# Patient Record
Sex: Female | Born: 1945 | Race: White | Hispanic: No | Smoking: Never smoker
Health system: Southern US, Community
[De-identification: ages and names within clinical notes are randomized; demographics above are authoritative.]

## PROBLEM LIST (undated history)

## (undated) DIAGNOSIS — I10 Essential (primary) hypertension: Secondary | ICD-10-CM

## (undated) DIAGNOSIS — F329 Major depressive disorder, single episode, unspecified: Secondary | ICD-10-CM

## (undated) DIAGNOSIS — M549 Dorsalgia, unspecified: Secondary | ICD-10-CM

## (undated) DIAGNOSIS — C569 Malignant neoplasm of unspecified ovary: Secondary | ICD-10-CM

## (undated) DIAGNOSIS — Z720 Tobacco use: Secondary | ICD-10-CM

## (undated) DIAGNOSIS — G4730 Sleep apnea, unspecified: Secondary | ICD-10-CM

## (undated) DIAGNOSIS — J449 Chronic obstructive pulmonary disease, unspecified: Secondary | ICD-10-CM

## (undated) DIAGNOSIS — F32A Depression, unspecified: Secondary | ICD-10-CM

## (undated) DIAGNOSIS — F419 Anxiety disorder, unspecified: Secondary | ICD-10-CM

## (undated) HISTORY — DX: Major depressive disorder, single episode, unspecified: F32.9

## (undated) HISTORY — DX: Tobacco use: Z72.0

## (undated) HISTORY — DX: Malignant neoplasm of unspecified ovary: C56.9

## (undated) HISTORY — DX: Anxiety disorder, unspecified: F41.9

## (undated) HISTORY — PX: ABDOMINAL HYSTERECTOMY: SHX81

## (undated) HISTORY — PX: JOINT REPLACEMENT: SHX530

## (undated) HISTORY — PX: OOPHORECTOMY: SHX86

## (undated) HISTORY — DX: Depression, unspecified: F32.A

## (undated) HISTORY — DX: Sleep apnea, unspecified: G47.30

## (undated) HISTORY — PX: HYSTERECTOMY ABDOMINAL WITH SALPINGECTOMY: SHX6725

## (undated) HISTORY — PX: KNEE SURGERY: SHX244

---

## 2014-06-22 ENCOUNTER — Emergency Department (HOSPITAL_COMMUNITY)
Admission: EM | Admit: 2014-06-22 | Discharge: 2014-06-22 | Disposition: A | Discharge status: Home / Self Care | Payer: Medicare Other | Attending: Emergency Medicine | Admitting: Emergency Medicine

## 2014-06-22 ENCOUNTER — Encounter (HOSPITAL_COMMUNITY): Payer: Self-pay | Admitting: Emergency Medicine

## 2014-06-22 DIAGNOSIS — M545 Low back pain, unspecified: Secondary | ICD-10-CM | POA: Diagnosis not present

## 2014-06-22 DIAGNOSIS — G8929 Other chronic pain: Secondary | ICD-10-CM | POA: Diagnosis not present

## 2014-06-22 DIAGNOSIS — M549 Dorsalgia, unspecified: Secondary | ICD-10-CM | POA: Insufficient documentation

## 2014-06-22 DIAGNOSIS — Z9889 Other specified postprocedural states: Secondary | ICD-10-CM | POA: Insufficient documentation

## 2014-06-22 HISTORY — DX: Dorsalgia, unspecified: M54.9

## 2014-06-22 MED ORDER — HYDROCODONE-ACETAMINOPHEN 5-325 MG PO TABS: 1.0000 | ORAL_TABLET | ORAL | Status: DC | PRN

## 2014-06-22 MED ORDER — HYDROCODONE-ACETAMINOPHEN 5-325 MG PO TABS
1.0000 | ORAL_TABLET | Freq: Once | ORAL | Status: AC
  Administered 2014-06-22: 1 via ORAL
  Filled 2014-06-22: qty 1

## 2014-06-22 MED ORDER — METHOCARBAMOL 500 MG PO TABS: 500.0000 mg | ORAL_TABLET | Freq: Two times a day (BID) | ORAL | Status: DC

## 2014-06-22 NOTE — Discharge Instructions (Signed)
Take the prescribed medication as directed for pain.  Use caution when driving. Follow-up with [facility]. Return to the ED for new or worsening symptoms.

## 2014-06-22 NOTE — ED Notes (Signed)
Pt has had 2 previous back surgeries since 2004.  Pt was taking pain meds, muscle relaxers and Xanax for depression in 2014. Pt takes Aleve and until 3 days ago Aleve was effective.

## 2014-06-22 NOTE — ED Notes (Signed)
Pt and daughter requesting muscle relaxant prescription.  Obtained from PA.

## 2014-06-22 NOTE — ED Notes (Signed)
Pt presents to department for evaluation of chronic back pain. Ongoing x3 years, pt states pain increased today. Denies recent injury. NAD.

## 2014-06-22 NOTE — ED Provider Notes (Signed)
CSN: [id number]     Arrival date & time 06/22/14  1753 History   First MD Initiated Contact with Patient 06/22/14 1814    This chart was scribed for non-physician practitioner, Quincy Carnes, working with Charlesetta Shanks, MD by Terressa Koyanagi, ED Scribe. This patient was seen in room TR08C/TR08C and the patient's care was started at 6:35 PM.  Chief Complaint  Patient presents with  . Back Pain   The history is provided by the patient. No language interpreter was used.   HPI Comments: PCP: No primary provider on file. Alexis Daniels is a 68 y.o. female, with Hx of chronic back pain and two back surgeries (2009 and 2011), who presents to the Emergency Department complaining of chronic back pain with the pain intensifying today. Pt reports that she is generally able to control her pain with Aleve, however, has been unable to do so today. Pt denies any recent injuries to said area or recent falls. Pt notes that she was on narcotic pain killers in the past for her back pain, however, in 2014 stopped taking all of her meds because it did not alleviate her back pain.  No numbness, paresthesias or weakness of extremities.  No loss of bowel or bladder control.  No recent fever, chills, sweats, or urinary symptoms.  Past Medical History  Diagnosis Date  . Back pain    History reviewed. No pertinent past surgical history. No family history on file. History  Substance Use Topics  . Smoking status: Never Smoker   . Smokeless tobacco: Not on file  . Alcohol Use: No   OB History   Grav Para Term Preterm Abortions TAB SAB Ect Mult Living                 Review of Systems  Constitutional: Negative for fever and chills.  Musculoskeletal: Positive for back pain.  Psychiatric/Behavioral: Negative for confusion.  All other systems reviewed and are negative.  Allergies  Review of patient's allergies indicates no known allergies.  Home Medications   Prior to Admission medications   Not on File   Triage  Vitals: BP 157/75  Pulse 82  Temp(Src) 98.6 F (37 C) (Oral)  Resp 16  SpO2 99% Physical Exam  Nursing note and vitals reviewed. Constitutional: She is oriented to person, place, and time. She appears well-developed and well-nourished.  Awake, alert, nontoxic appearance with baseline speech.  HENT:  Head: Normocephalic and atraumatic.  Mouth/Throat: Oropharynx is clear and moist.  Eyes: Conjunctivae and EOM are normal. Pupils are equal, round, and reactive to light. Right eye exhibits no discharge. Left eye exhibits no discharge.  Neck: Normal range of motion. Neck supple.  Cardiovascular: Normal rate, regular rhythm and normal heart sounds.   No murmur heard. Pulmonary/Chest: Effort normal and breath sounds normal. No respiratory distress. She has no wheezes. She has no rales. She exhibits no tenderness.  Abdominal: Soft. Bowel sounds are normal. She exhibits no mass. There is no tenderness. There is no rebound.  Musculoskeletal: Normal range of motion.       Thoracic back: She exhibits no tenderness.       Lumbar back: She exhibits no tenderness.  Well healed midline surgical incision, diffused tenderness noted throughout lumbar spine, no midline deformities or step offs. FROM maintained. Normal strength and sensation of bilateral extremities. Ambulating without difficulty.   Neurological: She is alert and oriented to person, place, and time.  Mental status baseline for patient.  Upper extremity motor strength and  sensation intact and symmetric bilaterally.  Skin: Skin is warm and dry. No rash noted.  Psychiatric: She has a normal mood and affect.    ED Course  Procedures (including critical care time) DIAGNOSTIC STUDIES: Oxygen Saturation is 99% on RA, nl by my interpretation.    COORDINATION OF CARE: 6:37 PM-Discussed treatment plan which includes vicodin with pt at bedside and pt agreed to plan. Pt reports she has a ride home.   Labs Review Labs Reviewed - No data to  display  Imaging Review No results found.   EKG Interpretation None      MDM   Final diagnoses:  Back pain, unspecified location   68 y.o. F with back pain, hx of same.  Came off all of her pain meds 1 year ago after husband died.  On exam, no red flag sx or focal neurologic deficits.  She is ambulating well without difficulties.  No real change in her pain, sx just not well controlled on her usual OTC medications.  No new injuries or falls to suggest new fx or subluxation thus x-ray deferred.  Will start on vicodin which she has taken in the past, also requests muscle relaxer, Rx robaxin.  Will FU with PCP.  Discussed plan with patient, he/she acknowledged understanding and agreed with plan of care.  Return precautions given for new or worsening symptoms.  I personally performed the services described in this documentation, which was scribed in my presence. The recorded information has been reviewed and is accurate.    Larene Pickett, PA-C 06/22/14 1947

## 2014-06-23 NOTE — ED Provider Notes (Signed)
Medical screening examination/treatment/procedure(s) were performed by non-physician practitioner and as supervising physician I was immediately available for consultation/collaboration.   EKG Interpretation None       Charlesetta Shanks, MD 06/23/14 [phone]

## 2014-08-07 ENCOUNTER — Encounter (HOSPITAL_COMMUNITY): Payer: Self-pay

## 2014-08-07 ENCOUNTER — Emergency Department (HOSPITAL_COMMUNITY)
Admission: EM | Admit: 2014-08-07 | Discharge: 2014-08-07 | Disposition: A | Discharge status: Home / Self Care | Payer: Medicare Other | Attending: Emergency Medicine | Admitting: Emergency Medicine

## 2014-08-07 DIAGNOSIS — M1712 Unilateral primary osteoarthritis, left knee: Secondary | ICD-10-CM | POA: Diagnosis not present

## 2014-08-07 DIAGNOSIS — M25562 Pain in left knee: Secondary | ICD-10-CM | POA: Diagnosis not present

## 2014-08-07 DIAGNOSIS — Z79899 Other long term (current) drug therapy: Secondary | ICD-10-CM | POA: Insufficient documentation

## 2014-08-07 DIAGNOSIS — M6289 Other specified disorders of muscle: Secondary | ICD-10-CM

## 2014-08-07 DIAGNOSIS — Z791 Long term (current) use of non-steroidal anti-inflammatories (NSAID): Secondary | ICD-10-CM | POA: Insufficient documentation

## 2014-08-07 DIAGNOSIS — M549 Dorsalgia, unspecified: Secondary | ICD-10-CM | POA: Diagnosis not present

## 2014-08-07 MED ORDER — NAPROXEN 500 MG PO TABS: 500.0000 mg | ORAL_TABLET | Freq: Two times a day (BID) | ORAL | Status: DC | PRN

## 2014-08-07 MED ORDER — OXYCODONE-ACETAMINOPHEN 5-325 MG PO TABS: 1.0000 | ORAL_TABLET | Freq: Four times a day (QID) | ORAL | Status: DC | PRN

## 2014-08-07 MED ORDER — CYCLOBENZAPRINE HCL 10 MG PO TABS
5.0000 mg | ORAL_TABLET | Freq: Once | ORAL | Status: AC
  Administered 2014-08-07: 5 mg via ORAL
  Filled 2014-08-07: qty 1

## 2014-08-07 MED ORDER — OXYCODONE-ACETAMINOPHEN 5-325 MG PO TABS
1.0000 | ORAL_TABLET | Freq: Once | ORAL | Status: AC
  Administered 2014-08-07: 1 via ORAL
  Filled 2014-08-07: qty 1

## 2014-08-07 MED ORDER — CYCLOBENZAPRINE HCL 10 MG PO TABS: 5.0000 mg | ORAL_TABLET | Freq: Three times a day (TID) | ORAL | Status: DC | PRN

## 2014-08-07 NOTE — ED Provider Notes (Signed)
CSN: [id number]     Arrival date & time 08/07/14  0932 History   This chart was scribed for non-physician practitioner, Zacarias Pontes, PA-C working with Virgel Manifold, MD, by Erling Conte, ED Scribe. This patient was seen in room TR05C/TR05C and the patient's care was started at 10:05 PM.     Chief Complaint  Patient presents with  . Knee Pain    Patient is a 68 y.o. female presenting with knee pain. The history is provided by the patient. No language interpreter was used.  Knee Pain Location:  Knee Time since incident:  2 days Injury: no   Knee location:  L knee Pain details:    Quality:  Pressure   Radiates to: L foot.   Pain severity now: 10/10.   Onset quality:  Sudden   Duration:  2 days   Timing:  Constant   Progression:  Unchanged Chronicity:  New Prior injury to area:  No Relieved by:  Nothing Worsened by:  Bearing weight (movement) Ineffective treatments: Tylenol. Associated symptoms: swelling and tingling   Associated symptoms: no back pain, no decreased ROM, no fatigue, no fever, no itching, no muscle weakness, no neck pain, no numbness and no stiffness   Risk factors: no concern for non-accidental trauma, no frequent fractures, no known bone disorder, no obesity and no recent illness     HPI Comments: Alexis Daniels is a 68 y.o. female who presents to the Emergency Department complaining of constant, "pressure like", "10/10", left knee pain that began 1 day ago. The pain radiates down the back of her left knee into her left foot. She states that she was just sitting in a chair when the pain happened, and that she "knows it's the muscles". Pt states she has been told she needed a replacement in this knee but has refused this. She endorses some tingling in the plantar aspect of the left foot. She complains that it feels like her left knee locks up. She denies any injury to the area. Daughter states she had this same pain about 1 week ago and eventually went away.   Pain is exacerbated by movement. She took Aleve with no relief. No h/o gout. Denies any numbness, weakness, fever, chills, CP, SOB, LE swelling, erythema, warmth, back pain, cauda equina symptoms, or rash.   Past Medical History  Diagnosis Date  . Back pain    Past Surgical History  Procedure Laterality Date  . Joint replacement    . Knee surgery Right    No family history on file. History  Substance Use Topics  . Smoking status: Never Smoker   . Smokeless tobacco: Not on file  . Alcohol Use: No   OB History    No data available     Review of Systems  Constitutional: Negative for fever, chills and fatigue.  Respiratory: Negative for shortness of breath.   Cardiovascular: Negative for chest pain and leg swelling.  Gastrointestinal: Negative for nausea, vomiting and abdominal pain.  Musculoskeletal: Positive for myalgias, joint swelling (left knee) and arthralgias (left knee). Negative for back pain, gait problem, stiffness and neck pain.  Skin: Negative for color change and itching.  Neurological: Negative for tremors, weakness and numbness.   10 Systems reviewed and all are negative for acute change except as noted in the HPI.     Allergies  Review of patient's allergies indicates no known allergies.  Home Medications   Prior to Admission medications   Medication Sig Start Date End Date Taking?  Authorizing Provider  HYDROcodone-acetaminophen (NORCO/VICODIN) 5-325 MG per tablet Take 1 tablet by mouth every 4 (four) hours as needed. 06/22/14   Larene Pickett, PA-C  methocarbamol (ROBAXIN) 500 MG tablet Take 1 tablet (500 mg total) by mouth 2 (two) times daily. 06/22/14   Larene Pickett, PA-C  naproxen sodium (ANAPROX) 220 MG tablet Take 220 mg by mouth 2 (two) times daily.    Historical Provider, MD   Triage Vitals: BP 149/96 mmHg  Pulse 98  Temp(Src) 98.1 F (36.7 C) (Oral)  Resp 18  Ht 5\' 5"  (1.651 m)  Wt 200 lb (90.719 kg)  BMI 33.28 kg/m2  SpO2 95%  Physical  Exam  Constitutional: She is oriented to person, place, and time. Vital signs are normal. She appears well-developed and well-nourished.  Non-toxic appearance. No distress.  Afebrile, nontoxic  HENT:  Head: Normocephalic and atraumatic.  Eyes: Conjunctivae and EOM are normal.  Neck: Normal range of motion. Neck supple.  Cardiovascular: Normal rate and intact distal pulses.   Distal pulses intact in all extremities  Pulmonary/Chest: Effort normal. No respiratory distress.  Abdominal: Normal appearance. She exhibits no distension.  Musculoskeletal:       Left knee: She exhibits decreased range of motion (due to pain). She exhibits no swelling, no effusion, no deformity, no erythema, normal alignment, no LCL laxity, normal patellar mobility, no bony tenderness and no MCL laxity. No tenderness found.  L knee ROM diminished due to pain, Passive range of motion intact. No swelling or effusion, no patellar ballottement, no varus or valgus laxity, no abnormal patellar mobility, no erythema, no warmth, no deformity. No midline or bony tenderness to palpation. No Baker cyst palpated on posterior knee, although hamstrings do feel very tight and somewhat contracted with spasm. Able to apply pressure into the leg, although this is painful. Gait is antalgic. Strength 5/5 in all extremities. Sensation grossly intact in all extremities. Distal pulses intact. No pedal edema, no calf swelling or TTP  Neurological: She is alert and oriented to person, place, and time. She has normal strength. No sensory deficit. Gait (antalgic) abnormal.  Skin: Skin is warm, dry and intact. No erythema.  No erythema or warmth  Psychiatric: She has a normal mood and affect. Her behavior is normal.  Nursing note and vitals reviewed.   ED Course  Procedures (including critical care time)  DIAGNOSTIC STUDIES: Oxygen Saturation is 95% on RA, adequate by my interpretation.    COORDINATION OF CARE: 10:13 PM- Advised pt to follow  up with an orthopedist. Pt asked for a PCP and orthopedic referral. Advised to apply heat and gentle massage to the back of the knee. Will order muscle relaxer and narcotic pain medication.  Advised family that if she develops numbness, calf swelling, redness, warmth, or shortness of breath to return to the ED. Pt advised of plan for treatment and pt agrees.    Labs Review Labs Reviewed - No data to display  Imaging Review No results found.   EKG Interpretation None      MDM   Final diagnoses:  Knee pain, left  Hamstring tightness of left lower extremity  Arthritis of left knee    68y/o female with acute on chronic left knee pain. History of knee arthritis and advised to have replacement, but she does not want to proceed with this. She is now having hamstring tightness with associated spasm, with exacerbation of knee pain. Neurovascularly intact with soft compartments. No concern for DVT. No bony tenderness  or need for emergent imaging at this time. Percocet and flexeril given here with some relief. Discussed massage, heat, as well as pain meds and muscle relaxers given. Discussed that it is important to follow-up with the orthopedist for ongoing management of her chronic knee pain, and definitive treatment as the treatments provided today are only temporary. Doubt gout or septic joint. I explained the diagnosis and have given explicit precautions to return to the ER including for any other new or worsening symptoms. The patient understands and accepts the medical plan as it's been dictated and I have answered their questions. Discharge instructions concerning home care and prescriptions have been given. The patient is STABLE and is discharged to home in good condition.   I personally performed the services described in this documentation, which was scribed in my presence. The recorded information has been reviewed and is accurate.  BP 149/96 mmHg  Pulse 98  Temp(Src) 98.1 F (36.7 C)  (Oral)  Resp 18  Ht 5\' 5"  (1.651 m)  Wt 200 lb (90.719 kg)  BMI 33.28 kg/m2  SpO2 95%  Meds ordered this encounter  Medications  . oxyCODONE-acetaminophen (PERCOCET/ROXICET) 5-325 MG per tablet 1 tablet    Sig:   . cyclobenzaprine (FLEXERIL) tablet 5 mg    Sig:   . oxyCODONE-acetaminophen (PERCOCET) 5-325 MG per tablet    Sig: Take 1-2 tablets by mouth every 6 (six) hours as needed for severe pain.    Dispense:  6 tablet    Refill:  0    Order Specific Question:  Supervising Provider    Answer:  Noemi Chapel D [[id number]]  . naproxen (NAPROSYN) 500 MG tablet    Sig: Take 1 tablet (500 mg total) by mouth 2 (two) times daily as needed for mild pain, moderate pain or headache (TAKE WITH MEALS.).    Dispense:  20 tablet    Refill:  0    Order Specific Question:  Supervising Provider    Answer:  Noemi Chapel D [[id number]]  . cyclobenzaprine (FLEXERIL) 10 MG tablet    Sig: Take 0.5 tablets (5 mg total) by mouth 3 (three) times daily as needed for muscle spasms.    Dispense:  15 tablet    Refill:  0    Order Specific Question:  Supervising Provider    Answer:  Johnna Acosta [address] [place], PA-C 08/07/14 2257  Virgel Manifold, MD 08/14/14 [phone]

## 2014-08-07 NOTE — Discharge Instructions (Signed)
Use heat to the muscles that are painful. Perform gentle stretching. Use naprosyn and percocet for pain but don't drive while taking these. Use flexeril for muscle spasm. See the orthopedist for ongoing care of your left knee pain which is likely related to aggravation of arthritis. Return to the ER for changes or worsening symptoms.   Arthritis, Nonspecific Arthritis is pain, redness, warmth, or puffiness (inflammation) of a joint. The joint may be stiff or hurt when you move it. One or more joints may be affected. There are many types of arthritis. Your doctor may not know what type you have right away. The most common cause of arthritis is wear and tear on the joint (osteoarthritis). HOME CARE   Only take medicine as told by your doctor.  Rest the joint as much as possible.  Raise (elevate) your joint if it is puffy.  Use crutches if the painful joint is in your leg.  Drink enough fluids to keep your pee (urine) clear or pale yellow.  Follow your doctor's diet instructions.  Use cold packs for very bad joint pain for 10 to 15 minutes every hour. Ask your doctor if it is okay for you to use hot packs.  Exercise as told by your doctor.  Take a warm shower if you have stiffness in the morning.  Move your sore joints throughout the day. GET HELP RIGHT AWAY IF:   You have a fever.  You have very bad joint pain, puffiness, or redness.  You have many joints that are painful and puffy.  You are not getting better with treatment.  You have very bad back pain or leg weakness.  You cannot control when you poop (bowel movement) or pee (urinate).  You do not feel better in 24 hours or are getting worse.  You are having side effects from your medicine. MAKE SURE YOU:   Understand these instructions.  Will watch your condition.  Will get help right away if you are not doing well or get worse. Document Released: 12/07/2009 Document Revised: 03/13/2012 Document Reviewed:  12/07/2009 [facility] Patient Information 2015 [place], [place]. This information is not intended to replace advice given to you by your health care provider. Make sure you discuss any questions you have with your health care provider.  Heat Therapy Heat therapy can help make painful, stiff muscles and joints feel better. Do not use heat on new injuries. Wait at least 48 hours after an injury to use heat. Do not use heat when you have aches or pains right after an activity. If you still have pain 3 hours after stopping the activity, then you may use heat. HOME CARE Wet heat pack  Soak a clean towel in warm water. Squeeze out the extra water.  Put the warm, wet towel in a plastic bag.  Place a thin, dry towel between your skin and the bag.  Put the heat pack on the area for 5 minutes, and check your skin. Your skin may be pink, but it should not be red.  Leave the heat pack on the area for 15 to 30 minutes.  Repeat this every 2 to 4 hours while awake. Do not use heat while you are sleeping. Warm water bath  Fill a tub with warm water.  Place the affected body part in the tub.  Soak the area for 20 to 40 minutes.  Repeat as needed. Hot water bottle  Fill the water bottle half full with hot water.  Press out the  extra air. Close the cap tightly.  Place a dry towel between your skin and the bottle.  Put the bottle on the area for 5 minutes, and check your skin. Your skin may be pink, but it should not be red.  Leave the bottle on the area for 15 to 30 minutes.  Repeat this every 2 to 4 hours while awake. Electric heating pad  Place a dry towel between your skin and the heating pad.  Set the heating pad on low heat.  Put the heating pad on the area for 10 minutes, and check your skin. Your skin may be pink, but it should not be red.  Leave the heating pad on the area for 20 to 40 minutes.  Repeat this every 2 to 4 hours while awake.  Do not lie on the heating pad.  Do  not fall asleep while using the heating pad.  Do not use the heating pad near water. GET HELP RIGHT AWAY IF:  You get blisters or red skin.  Your skin is puffy (swollen), or you lose feeling (numbness) in the affected area.  You have any new problems.  Your problems are getting worse.  You have any questions or concerns. If you have any problems, stop using heat therapy until you see your doctor. MAKE SURE YOU:  Understand these instructions.  Will watch your condition.  Will get help right away if you are not doing well or get worse. Document Released: 12/05/2011 Document Reviewed: 11/05/2013 [facility] Patient Information 2015 [place]. This information is not intended to replace advice given to you by your health care provider. Make sure you discuss any questions you have with your health care provider.  Spasticity Spasticity is a condition in which certain muscles contract continuously. This causes stiffness or tightness of the muscles. It may interfere with movement, speech, and manner of walking. CAUSES  This condition is usually caused by damage to the portion of the brain or spinal cord that controls voluntary movement. It may occur in association with:  Spinal cord injury.  Multiple sclerosis.  Cerebral palsy.  Brain damage due to lack of oxygen.  Brain trauma.  Severe head injury.  Metabolic diseases such as:  Adrenoleukodystrophy.  ALS [place] Cerise Gehrig's disease).  Phenylketonuria. SYMPTOMS   Increased muscle tone (hypertonicity).  A series of rapid muscle contractions (clonus).  Exaggerated deep tendon reflexes.  Muscle spasms.  Involuntary crossing of the legs (scissoring).  Fixed joints. The degree of spasticity varies. It ranges from mild muscle stiffness to severe, painful, and uncontrollable muscle spasms. It can interfere with rehabilitation in patients with certain disorders. It often interferes with daily activities. TREATMENT    Treatment may include:  Medications.  Physical therapy regimens. They may include muscle stretching and range of motion exercises. These help prevent shrinkage or shortening of muscles. They also help reduce the severity of symptoms.  Surgery. This may be recommended for tendon release or to sever the nerve-muscle pathway. PROGNOSIS  The outcome for those with spasticity depends on:  Severity of the spasticity.  Associated disorder(s). Document Released: 09/02/2002 Document Revised: 12/05/2011 Document Reviewed: 11/26/2013 [facility] Patient Information 2015 [place], [place]. This information is not intended to replace advice given to you by your health care provider. Make sure you discuss any questions you have with your health care provider.

## 2014-08-07 NOTE — ED Notes (Signed)
Pt has left knee pain and swelling since yesterday. States it just locks up. No known injury.

## 2014-08-16 ENCOUNTER — Encounter (HOSPITAL_COMMUNITY): Payer: Self-pay | Admitting: Nurse Practitioner

## 2014-08-16 ENCOUNTER — Emergency Department (HOSPITAL_COMMUNITY): Payer: Medicare Other

## 2014-08-16 ENCOUNTER — Emergency Department (HOSPITAL_COMMUNITY)
Admission: EM | Admit: 2014-08-16 | Discharge: 2014-08-16 | Disposition: A | Discharge status: Home / Self Care | Payer: Medicare Other | Attending: Emergency Medicine | Admitting: Emergency Medicine

## 2014-08-16 DIAGNOSIS — S79912A Unspecified injury of left hip, initial encounter: Secondary | ICD-10-CM | POA: Diagnosis not present

## 2014-08-16 DIAGNOSIS — Z791 Long term (current) use of non-steroidal anti-inflammatories (NSAID): Secondary | ICD-10-CM | POA: Insufficient documentation

## 2014-08-16 DIAGNOSIS — S0993XA Unspecified injury of face, initial encounter: Secondary | ICD-10-CM | POA: Diagnosis present

## 2014-08-16 DIAGNOSIS — Y9389 Activity, other specified: Secondary | ICD-10-CM | POA: Diagnosis not present

## 2014-08-16 DIAGNOSIS — Z9889 Other specified postprocedural states: Secondary | ICD-10-CM | POA: Diagnosis not present

## 2014-08-16 DIAGNOSIS — Y998 Other external cause status: Secondary | ICD-10-CM | POA: Diagnosis not present

## 2014-08-16 DIAGNOSIS — Y92009 Unspecified place in unspecified non-institutional (private) residence as the place of occurrence of the external cause: Secondary | ICD-10-CM | POA: Diagnosis not present

## 2014-08-16 DIAGNOSIS — W1809XA Striking against other object with subsequent fall, initial encounter: Secondary | ICD-10-CM | POA: Diagnosis not present

## 2014-08-16 DIAGNOSIS — S0031XA Abrasion of nose, initial encounter: Secondary | ICD-10-CM | POA: Insufficient documentation

## 2014-08-16 DIAGNOSIS — Z79899 Other long term (current) drug therapy: Secondary | ICD-10-CM | POA: Diagnosis not present

## 2014-08-16 DIAGNOSIS — T148XXA Other injury of unspecified body region, initial encounter: Secondary | ICD-10-CM

## 2014-08-16 DIAGNOSIS — W19XXXA Unspecified fall, initial encounter: Secondary | ICD-10-CM

## 2014-08-16 DIAGNOSIS — S8992XA Unspecified injury of left lower leg, initial encounter: Secondary | ICD-10-CM | POA: Diagnosis not present

## 2014-08-16 DIAGNOSIS — Z880 Allergy status to penicillin: Secondary | ICD-10-CM | POA: Insufficient documentation

## 2014-08-16 MED ORDER — HYDROCODONE-ACETAMINOPHEN 5-325 MG PO TABS
1.0000 | ORAL_TABLET | Freq: Once | ORAL | Status: AC
  Administered 2014-08-16: 1 via ORAL
  Filled 2014-08-16: qty 1

## 2014-08-16 NOTE — ED Notes (Signed)
She tripped over a box and fell onto floor landing on her face. She denies LOC. She is A&Ox4 now. She c/o L knee, L hip, nose pain since. Abrasion to bridge of nose.

## 2014-08-16 NOTE — Discharge Instructions (Signed)
Please be very careful not to fall! The pain medication and crutches puts you at risk for falls. Please rest as much as possible and try to not stay alone.   Please follow with your primary care doctor in the next 2 days for a check-up. They must obtain records for further management.   Do not hesitate to return to the Emergency Department for any new, worsening or concerning symptoms.   Wash the affected area with soap and water and apply a thin layer of topical antibiotic ointment. Do this every 12 hours.   Do not use rubbing alcohol or hydrogen peroxide.                        Look for signs of infection: if you see redness, if the area becomes warm, if pain increases sharply, there is discharge (pus), if red streaks appear or you develop fever or vomiting, RETURN immediately to the Emergency Department  for a recheck.    Abrasions An abrasion is a cut or scrape of the skin. Abrasions do not go through all layers of the skin. HOME CARE  If a bandage (dressing) was put on your wound, change it as told by your doctor. If the bandage sticks, soak it off with warm.  Wash the area with water and soap 2 times a day. Rinse off the soap. Pat the area dry with a clean towel.  Put on medicated cream (ointment) as told by your doctor.  Change your bandage right away if it gets wet or dirty.  Only take medicine as told by your doctor.  See your doctor within 24-48 hours to get your wound checked.  Check your wound for redness, puffiness (swelling), or yellowish-white fluid (pus). GET HELP RIGHT AWAY IF:   You have more pain in the wound.  You have redness, swelling, or tenderness around the wound.  You have pus coming from the wound.  You have a fever or lasting symptoms for more than 2-3 days.  You have a fever and your symptoms suddenly get worse.  You have a bad smell coming from the wound or bandage. MAKE SURE YOU:   Understand these instructions.  Will watch your  condition.  Will get help right away if you are not doing well or get worse. Document Released: 02/29/2008 Document Revised: 06/06/2012 Document Reviewed: 08/16/2011 [facility] Patient Information 2015 [place], [place]. This information is not intended to replace advice given to you by your health care provider. Make sure you discuss any questions you have with your health care provider.

## 2014-08-16 NOTE — ED Provider Notes (Signed)
CSN: [id number]     Arrival date & time 08/16/14  1444 History  This chart was scribed for [employer], PA-C, working with [employer]. Alvino Chapel, MD found by Starleen Arms, ED Scribe. This patient was seen in room TR08C/TR08C and the patient's care was started at 3:02 PM.   Chief Complaint  Patient presents with  . Fall   The history is provided by the patient. No language interpreter was used.   HPI Comments: Alexis Daniels is a 68 y.o. female who presents to the Emergency Department complaining of a mechanical fall that occurred earlier today.  The patient reports she tripped over a box and landed face first on a hardwood floor.  She denies LOC and was able to walk immediately afterward.  Patient states that her nose began to bleeed immediately after the fall but it is currently controlled.  She complains currently of left knee and ankle pain.  Patient states she takes 2 aleve daily and has done so today.  Patient reports she had a tetanus shot 4 years ago.  Patient denies use of anti-coagulants.  Patient denies CP, back pain, no EtOH or meds that would alter awareness.   Past Medical History  Diagnosis Date  . Back pain    Past Surgical History  Procedure Laterality Date  . Joint replacement    . Knee surgery Right    History reviewed. No pertinent family history. History  Substance Use Topics  . Smoking status: Never Smoker   . Smokeless tobacco: Not on file  . Alcohol Use: No   OB History    No data available     Review of Systems A complete 10 system review of systems was obtained and all systems are negative except as noted in the HPI and PMH.   Allergies  Morphine and related and Penicillins  Home Medications   Prior to Admission medications   Medication Sig Start Date End Date Taking? Authorizing Provider  cyclobenzaprine (FLEXERIL) 10 MG tablet Take 0.5 tablets (5 mg total) by mouth 3 (three) times daily as needed for muscle spasms. 08/07/14  Yes Mercedes Strupp  Camprubi-Soms, PA-C  methocarbamol (ROBAXIN) 500 MG tablet Take 1 tablet (500 mg total) by mouth 2 (two) times daily. 06/22/14  Yes Larene Pickett, PA-C  naproxen (NAPROSYN) 500 MG tablet Take 1 tablet (500 mg total) by mouth 2 (two) times daily as needed for mild pain, moderate pain or headache (TAKE WITH MEALS.). 08/07/14  Yes Mercedes Strupp Camprubi-Soms, PA-C  naproxen sodium (ANAPROX) 220 MG tablet Take 220 mg by mouth 2 (two) times daily.   Yes Historical Provider, MD  oxyCODONE-acetaminophen (PERCOCET) 5-325 MG per tablet Take 1-2 tablets by mouth every 6 (six) hours as needed for severe pain. 08/07/14  Yes Mercedes Strupp Camprubi-Soms, PA-C  HYDROcodone-acetaminophen (NORCO/VICODIN) 5-325 MG per tablet Take 1 tablet by mouth every 4 (four) hours as needed. 06/22/14   Larene Pickett, PA-C   BP 140/100 mmHg  Pulse 99  Temp(Src) 98 F (36.7 C)  Resp 16  SpO2 96% Physical Exam  Constitutional: She is oriented to person, place, and time. She appears well-developed and well-nourished. No distress.  HENT:  Head: Normocephalic.  Mouth/Throat: Oropharynx is clear and moist.  Swelling, TTP  and abrasion over the nasal bridge. There is dried blood in the left nostril. There is no septal hematoma.  No hemotympanum, battle signs or raccoon's eyes  No crepitance or tenderness to palpation along the orbital rim.  EOMI intact  with no pain or diplopia  No abnormal otorrhea or rhinorrhea. Nasal septum midline.  No intraoral trauma.      Eyes: Conjunctivae and EOM are normal. Pupils are equal, round, and reactive to light.  Neck: Normal range of motion. Neck supple.  No midline C-spine  tenderness to palpation or step-offs appreciated. Patient has full range of motion without pain.   Cardiovascular: Normal rate, regular rhythm and intact distal pulses.   Pulmonary/Chest: Effort normal and breath sounds normal. No stridor. No respiratory distress. She has no wheezes. She has no rales. She  exhibits no tenderness.  Abdominal: Soft. Bowel sounds are normal. She exhibits no distension and no mass. There is no tenderness. There is no rebound and no guarding.  Musculoskeletal: Normal range of motion. She exhibits tenderness.  No tenderness to palpation of midline C-spine, thoracic or lumbar spine. Patient has full range of motion of neck with no pain.  Mild tenderness palpation over left greater trochanter.  Left knee: No deformity, erythema or abrasions. FROM. No effusion, ++crepitance. Anterior and posterior drawer show no abnormal laxity. Stable to valgus and varus stress. Joint lines are non-tender. Neurovascularly intact. Pt ambulates with antalgic gait.  Diffuse to palpation along the anterior knee.  No bony tenderness palpation along the shoulder, elbows or wrists, no snuffbox tenderness.  Left ankle: no bony TTP, Pt abulatory  Neurological: She is alert and oriented to person, place, and time.  Follows commands, Clear, goal oriented speech, Strength is 5 out of 5x4 extremities, patient ambulates with a coordinated in nonantalgic gait. Sensation is grossly intact.   Psychiatric: She has a normal mood and affect.  Nursing note and vitals reviewed.   ED Course  Procedures (including critical care time)  DIAGNOSTIC STUDIES: Oxygen Saturation is 96% on RA, normal by my interpretation.    COORDINATION OF CARE:  3:06 PM Will order imaging and pain medication.  Will provide crutches.  Patient acknowledges and agrees with plan.    Labs Review Labs Reviewed - No data to display  Imaging Review Dg Nasal Bones  08/16/2014   CLINICAL DATA:  Fall with nose injury and pain.  EXAM: NASAL BONES - 3+ VIEW  COMPARISON:  None.  FINDINGS: There is no evidence of fracture or other bone abnormality.  IMPRESSION: Negative.   Electronically Signed   By: Hassan Rowan M.D.   On: 08/16/2014 16:44   Dg Hip Complete Left  08/16/2014   CLINICAL DATA:  Fall with left hip injury and pain.  EXAM:  LEFT HIP - COMPLETE 2+ VIEW  COMPARISON:  None.  FINDINGS: There is no evidence of acute fracture, subluxation or dislocation.  The left hip is unremarkable.  Surgical changes in the lower lumbar spine are identified.  IMPRESSION: No evidence of acute bony abnormality.   Electronically Signed   By: Hassan Rowan M.D.   On: 08/16/2014 16:46   Dg Knee Complete 4 Views Left  08/16/2014   CLINICAL DATA:  [place] in bedroom at daughter's house, tripped over box landing on hardwood floor, LEFT knee pain  EXAM: LEFT KNEE - COMPLETE 4+ VIEW  COMPARISON:  None.  FINDINGS: Osseous demineralization.  Tricompartmental osteoarthritic changes with joint space narrowing and spur formation greatest at patellofemoral joint.  No acute fracture, dislocation, or bone destruction.  Small knee joint effusion.  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  No acute fracture or dislocation identified.   Electronically Signed  By: Lavonia Dana M.D.   On: 08/16/2014 16:46     EKG Interpretation None      MDM   Final diagnoses:  Fall at home, initial encounter  Abrasion    Filed Vitals:   08/16/14 1449  BP: 140/100  Pulse: 99  Temp: 98 F (36.7 C)  Resp: 16  SpO2: 96%    Medications  HYDROcodone-acetaminophen (NORCO/VICODIN) 5-325 MG per tablet 1 tablet (1 tablet Oral Given 08/16/14 1551)    Alexis Daniels is a 68 y.o. female presenting with nasal bone trauma after mechanical fall ar home. No anticoagulation and neuro exam non-focal. Pt also with no cervicalgia.   Will obtain nasal XR and hip and knee. No ankle imaging based on [place] rules.   XRs negative will DC with crutches, Pt can c/w aleve  Evaluation does not show pathology that would require ongoing emergent intervention or inpatient treatment. Pt is hemodynamically stable and mentating appropriately. Discussed findings and plan with patient/guardian, who agrees with care  plan. All questions answered. Return precautions discussed and outpatient follow up given.   I personally performed the services described in this documentation, which was scribed in my presence. The recorded information has been reviewed and is accurate.     Monico Blitz, PA-C 08/18/14 [place] Alvino Chapel, [place] 08/25/14 [phone]

## 2014-08-16 NOTE — ED Notes (Signed)
Declined W/C at D/C and was escorted to lobby by RN. 

## 2014-08-16 NOTE — ED Notes (Signed)
Pt given crutches and education on using them

## 2014-12-19 ENCOUNTER — Emergency Department (HOSPITAL_COMMUNITY)
Admission: EM | Admit: 2014-12-19 | Discharge: 2014-12-19 | Disposition: A | Discharge status: Home / Self Care | Payer: Medicare Other | Attending: Emergency Medicine | Admitting: Emergency Medicine

## 2014-12-19 ENCOUNTER — Encounter (HOSPITAL_COMMUNITY): Payer: Self-pay | Admitting: Nurse Practitioner

## 2014-12-19 ENCOUNTER — Emergency Department (HOSPITAL_COMMUNITY): Payer: Medicare Other

## 2014-12-19 DIAGNOSIS — Z79899 Other long term (current) drug therapy: Secondary | ICD-10-CM | POA: Diagnosis not present

## 2014-12-19 DIAGNOSIS — Z880 Allergy status to penicillin: Secondary | ICD-10-CM | POA: Diagnosis not present

## 2014-12-19 DIAGNOSIS — Z791 Long term (current) use of non-steroidal anti-inflammatories (NSAID): Secondary | ICD-10-CM | POA: Insufficient documentation

## 2014-12-19 DIAGNOSIS — M24031 Loose body in right wrist: Secondary | ICD-10-CM | POA: Diagnosis not present

## 2014-12-19 DIAGNOSIS — Z87891 Personal history of nicotine dependence: Secondary | ICD-10-CM | POA: Diagnosis not present

## 2014-12-19 DIAGNOSIS — M25531 Pain in right wrist: Secondary | ICD-10-CM | POA: Diagnosis present

## 2014-12-19 MED ORDER — IBUPROFEN 800 MG PO TABS: 800.0000 mg | ORAL_TABLET | Freq: Three times a day (TID) | ORAL | Status: DC

## 2014-12-19 MED ORDER — ONDANSETRON 4 MG PO TBDP
4.0000 mg | ORAL_TABLET | Freq: Once | ORAL | Status: AC
Start: 2014-12-19 — End: 2014-12-19
  Administered 2014-12-19: 4 mg via ORAL
  Filled 2014-12-19: qty 1

## 2014-12-19 MED ORDER — HYDROCODONE-ACETAMINOPHEN 5-325 MG PO TABS
1.0000 | ORAL_TABLET | Freq: Once | ORAL | Status: AC
  Administered 2014-12-19: 1 via ORAL
  Filled 2014-12-19: qty 1

## 2014-12-19 NOTE — Discharge Instructions (Signed)
Wrist Arthroscopy Arthroscopy is a valuable test for evaluating the wrist joint. It is used mostly for diagnosis (finding a problem). Arthroscopy is a surgical technique that uses small incisions (cut made by the surgeon) to insert a small telescope-like instrument (arthroscope) and other tools into the joint. This allows the surgeon to look directly at the problem. The arthroscope then beams light into the joint and sends an image to a screen. As your surgeon examines your wrist, he or she can also repair a number of problems found at the same time. Sometimes the surgery may have to be changed to an open surgery if the problems are too severe. This is usually a very safe surgery. Possible complications include: damage to nerves or blood vessels, excess bleeding, blood clots, the possibility of infection, and rarely instrument failure. This is most often performed as a same day surgery. This means you will not have to stay in the hospital overnight. Recovery from this surgery is also much faster than having an open procedure. LET YOUR CAREGIVER KNOW ABOUT:  Allergies.  Medications taken including herbs, eye drops, over-the-counter medications, and creams.  Use of steroids (by mouth or creams).  Previous problems with anesthetics or numbing medicines.  Possibility of pregnancy, if this applies  History of blood clots (thrombophlebitis).  History of bleeding or blood problems.  Previous surgery.  Other health problems. BEFORE THE PROCEDURE   Stop all anti-inflammatory medications at least 1 week prior to surgery unless instructed otherwise. Make sure to let your surgeon know if you have been taking cortisone.  Do not eat or drink after midnight or as instructed. Take medications as your caregiver instructs. You may have lab tests the morning of surgery.  You should be present 60 minutes prior to your procedure or as directed. PROCEDURE  You may have a general anesthetic. This is medication  that makes you sleepy during the procedure. You may have a local anesthetic in which just the part being worked on is numb. Your surgeon, anesthesiologist, or anesthetist will discuss this with you. Some of the most commonly encountered problems in the wrist joint area are ligament tears and damage to the cartilage. Ligamentous tears may have to be repaired with an open procedure. Cartilage tears may be shaved off or loose pieces of cartilage removed. AFTER THE PROCEDURE   After surgery, you will be taken to the recovery area where a nurse will watch and check your progress. Once you are awake, stable, and taking fluids well, barring other problems you will be allowed to go home.  Once home, an ice pack applied to your operative site for 20 minutes, 3 to 4 times per day, for 2 to 3 days, may help with discomfort and keep the swelling down.  Use medications as prescribed.  Do range of motion exercises with wrist as directed.  Keep your wrist elevated and wrapped to keep swelling down and to decrease pain. This also speeds healing. REHABILITATION  Wrists are often swollen, stiff, and painful following surgery. The recovery time will vary with the procedure done, damage repaired, and the general health and age of the patient. Splints, casts, or bandages will be worn for various amounts of time.  If physical therapy and exercises are prescribed by your surgeon, follow them carefully.  Avoid gripping or lifting heavy objects.  Only take over-the-counter or prescription medicines for pain, discomfort, or fever as directed by your caregiver. SEEK IMMEDIATE MEDICAL CARE IF:   There is redness, swelling, or  increasing pain in the wound or joint.  You notice purulent drainage (pus) coming from the wound.  An unexplained oral temperature above 102 F (38.9 C) develops.  You notice a foul smell coming from the wound or dressing.  There is a breaking open of the wound (edges not staying together)  after sutures or tape has been removed. Document Released: 09/09/2000 Document Revised: 12/05/2011 Document Reviewed: 12/24/2013 [facility] Patient Information 2015 [place], [place]. This information is not intended to replace advice given to you by your health care provider. Make sure you discuss any questions you have with your health care provider.

## 2014-12-19 NOTE — ED Notes (Signed)
Pt. States yesterday noticed right wrist pain. Noticed swelling and knot to right wrist. sts has increased in size since yesterday. Patient able to move wrist. Denies injury.

## 2014-12-19 NOTE — ED Provider Notes (Signed)
CSN: [id number]     Arrival date & time 12/19/14  6283 History  This chart was scribed for non-physician practitioner Delos Haring working with Francine Graven, DO by Donato Schultz, ED Scribe. This patient was seen in room TR09C/TR09C and the patient's care was started at 9:30 AM.    Chief Complaint  Patient presents with  . Wrist Pain   Patient is a 69 y.o. female presenting with wrist pain. The history is provided by the patient. No language interpreter was used.  Wrist Pain   HPI Comments: Alexis Daniels is a 69 y.o. female with a history of carpal tunnel who presents to the Emergency Department complaining of constant right wrist pain with acute sudden onset yesterday.  She denies any recent injury.  The pain she is experiencing now is different from her carpal tunnel symptoms.  She took Aleve PTA with no relief to her symptoms.  She lists numbness and tingling in her right thumb as an associated symptom.  She does not take anticoagulants.   Past Medical History  Diagnosis Date  . Back pain    Past Surgical History  Procedure Laterality Date  . Joint replacement    . Knee surgery Right    No family history on file. History  Substance Use Topics  . Smoking status: Former Research scientist (life sciences)  . Smokeless tobacco: Current User    Types: Snuff  . Alcohol Use: No   OB History    No data available     Review of Systems  Musculoskeletal: Positive for arthralgias.  Neurological: Positive for numbness.  All other systems reviewed and are negative.     Allergies  Morphine and related and Penicillins  Home Medications   Prior to Admission medications   Medication Sig Start Date End Date Taking? Authorizing Provider  cyclobenzaprine (FLEXERIL) 10 MG tablet Take 0.5 tablets (5 mg total) by mouth 3 (three) times daily as needed for muscle spasms. 08/07/14   Mercedes Camprubi-Soms, PA-C  HYDROcodone-acetaminophen (NORCO/VICODIN) 5-325 MG per tablet Take 1 tablet by mouth every 4 (four) hours  as needed. 06/22/14   Larene Pickett, PA-C  methocarbamol (ROBAXIN) 500 MG tablet Take 1 tablet (500 mg total) by mouth 2 (two) times daily. 06/22/14   Larene Pickett, PA-C  naproxen (NAPROSYN) 500 MG tablet Take 1 tablet (500 mg total) by mouth 2 (two) times daily as needed for mild pain, moderate pain or headache (TAKE WITH MEALS.). 08/07/14   Mercedes Camprubi-Soms, PA-C  naproxen sodium (ANAPROX) 220 MG tablet Take 220 mg by mouth 2 (two) times daily.    Historical Provider, MD  oxyCODONE-acetaminophen (PERCOCET) 5-325 MG per tablet Take 1-2 tablets by mouth every 6 (six) hours as needed for severe pain. 08/07/14   Mercedes Camprubi-Soms, PA-C   Triage Vitals: BP 153/96 mmHg  Pulse 88  Temp(Src) 97.6 F (36.4 C) (Oral)  Resp 18  Ht 5\' 6"  (1.676 m)  Wt 200 lb (90.719 kg)  BMI 32.30 kg/m2  SpO2 97%  Physical Exam  Constitutional: She is oriented to person, place, and time. She appears well-developed and well-nourished.  HENT:  Head: Normocephalic and atraumatic.  Eyes: EOM are normal.  Neck: Normal range of motion.  Cardiovascular: Normal rate.   Pulmonary/Chest: Effort normal.  Musculoskeletal:       Right wrist: She exhibits decreased range of motion, tenderness, bony tenderness, swelling and effusion. She exhibits no crepitus, no deformity and no laceration.  Neurological: She is alert and oriented to person, place, and  time.  Skin: Skin is warm and dry.  Psychiatric: She has a normal mood and affect. Her behavior is normal.  Nursing note and vitals reviewed.   ED Course  Procedures (including critical care time)  DIAGNOSTIC STUDIES: Oxygen Saturation is 97% on room air, normal by my interpretation.    COORDINATION OF CARE: 9:31 AM- Will obtain an xray of the patient's right wrist and the patient agreed to the treatment plan.  Labs Review Labs Reviewed - No data to display  Imaging Review Dg Wrist Complete Right  12/19/2014   CLINICAL DATA:  Wrist pain.  No known  injury.  EXAM: RIGHT WRIST - COMPLETE 3+ VIEW  COMPARISON:  None.  FINDINGS: There is no fracture or dislocation. There is suggestion of a wrist joint effusion. There is a small calcification at the volar aspect of the radiocarpal joint which could represent a small loose body in the joint.  IMPRESSION: Probable wrist joint effusion. Small loose body in the radiocarpal joint.   Electronically Signed   By: Lorriane Shire M.D.   On: 12/19/2014 11:06     EKG Interpretation None      MDM   Final diagnoses:  Loose body of wrist, right    Small loose body to right wrist with associated joint effusion. Pts pain and symptoms are consistent with findings.  Will place in velcro wrist splint and hydrocodone- wrist splint placed because pt tells me she will most likely not follow-up with Hand surgeon. Pt does not want Vicodin rx and prefers to stick with Ibuprofen. Referral to Dr. Lenon Curt - no acute injury Pt discussed with Dr. Thurnell Garbe who has seen the patient as well.  70 y.o.Alexis Daniels's evaluation in the Emergency Department is complete. It has been determined that no acute conditions requiring further emergency intervention are present at this time. The patient/guardian have been advised of the diagnosis and plan. We have discussed signs and symptoms that warrant return to the ED, such as changes or worsening in symptoms.  Vital signs are stable at discharge. Filed Vitals:   12/19/14 0906  BP: 153/96  Pulse: 88  Temp: 97.6 F (36.4 C)  Resp: 18    Patient/guardian has voiced understanding and agreed to follow-up with the PCP or specialist.  I personally performed the services described in this documentation, which was scribed in my presence. The recorded information has been reviewed and is accurate.   Delos Haring, PA-C 12/19/14 [place], DO 12/20/14 1413

## 2015-03-12 ENCOUNTER — Encounter (HOSPITAL_COMMUNITY): Payer: Self-pay | Admitting: Physical Medicine and Rehabilitation

## 2015-03-12 ENCOUNTER — Emergency Department (HOSPITAL_COMMUNITY)
Admission: EM | Admit: 2015-03-12 | Discharge: 2015-03-12 | Disposition: A | Discharge status: Home / Self Care | Payer: Medicare Other | Attending: Emergency Medicine | Admitting: Emergency Medicine

## 2015-03-12 ENCOUNTER — Emergency Department (HOSPITAL_COMMUNITY): Payer: Medicare Other

## 2015-03-12 DIAGNOSIS — Z880 Allergy status to penicillin: Secondary | ICD-10-CM | POA: Diagnosis not present

## 2015-03-12 DIAGNOSIS — R059 Cough, unspecified: Secondary | ICD-10-CM

## 2015-03-12 DIAGNOSIS — J441 Chronic obstructive pulmonary disease with (acute) exacerbation: Secondary | ICD-10-CM | POA: Diagnosis not present

## 2015-03-12 DIAGNOSIS — M549 Dorsalgia, unspecified: Secondary | ICD-10-CM | POA: Diagnosis not present

## 2015-03-12 DIAGNOSIS — R05 Cough: Secondary | ICD-10-CM

## 2015-03-12 DIAGNOSIS — Z79899 Other long term (current) drug therapy: Secondary | ICD-10-CM | POA: Diagnosis not present

## 2015-03-12 DIAGNOSIS — J189 Pneumonia, unspecified organism: Secondary | ICD-10-CM

## 2015-03-12 DIAGNOSIS — R0602 Shortness of breath: Secondary | ICD-10-CM | POA: Diagnosis present

## 2015-03-12 DIAGNOSIS — Z87891 Personal history of nicotine dependence: Secondary | ICD-10-CM | POA: Diagnosis not present

## 2015-03-12 DIAGNOSIS — J159 Unspecified bacterial pneumonia: Secondary | ICD-10-CM | POA: Insufficient documentation

## 2015-03-12 DIAGNOSIS — Z791 Long term (current) use of non-steroidal anti-inflammatories (NSAID): Secondary | ICD-10-CM | POA: Insufficient documentation

## 2015-03-12 HISTORY — DX: Chronic obstructive pulmonary disease, unspecified: J44.9

## 2015-03-12 LAB — CBC WITH DIFFERENTIAL/PLATELET
BASOS PCT: 1 % (ref 0–1)
Basophils Absolute: 0.1 10*3/uL (ref 0.0–0.1)
EOS ABS: 0.3 10*3/uL (ref 0.0–0.7)
Eosinophils Relative: 5 % (ref 0–5)
HEMATOCRIT: 45.1 % (ref 36.0–46.0)
Hemoglobin: 15.2 g/dL — ABNORMAL HIGH (ref 12.0–15.0)
Lymphocytes Relative: 29 % (ref 12–46)
Lymphs Abs: 2 10*3/uL (ref 0.7–4.0)
MCH: 29.5 pg (ref 26.0–34.0)
MCHC: 33.7 g/dL (ref 30.0–36.0)
MCV: 87.4 fL (ref 78.0–100.0)
MONOS PCT: 18 % — AB (ref 3–12)
Monocytes Absolute: 1.2 10*3/uL — ABNORMAL HIGH (ref 0.1–1.0)
Neutro Abs: 3.2 10*3/uL (ref 1.7–7.7)
Neutrophils Relative %: 47 % (ref 43–77)
PLATELETS: 266 10*3/uL (ref 150–400)
RBC: 5.16 MIL/uL — AB (ref 3.87–5.11)
RDW: 12.7 % (ref 11.5–15.5)
WBC: 6.8 10*3/uL (ref 4.0–10.5)

## 2015-03-12 LAB — I-STAT TROPONIN, ED: Troponin i, poc: 0 ng/mL (ref 0.00–0.08)

## 2015-03-12 LAB — BASIC METABOLIC PANEL
Anion gap: 10 (ref 5–15)
BUN: 8 mg/dL (ref 6–20)
CALCIUM: 9.7 mg/dL (ref 8.9–10.3)
CHLORIDE: 109 mmol/L (ref 101–111)
CO2: 20 mmol/L — AB (ref 22–32)
Creatinine, Ser: 0.94 mg/dL (ref 0.44–1.00)
GFR calc Af Amer: >60 mL/min (ref >60)
GFR calc non Af Amer: >60 mL/min (ref >60)
GLUCOSE: 106 mg/dL — AB (ref 65–99)
POTASSIUM: 4.1 mmol/L (ref 3.5–5.1)
Sodium: 139 mmol/L (ref 135–145)

## 2015-03-12 MED ORDER — IPRATROPIUM-ALBUTEROL 0.5-2.5 (3) MG/3ML IN SOLN
3.0000 mL | Freq: Once | RESPIRATORY_TRACT | Status: AC
Start: 2015-03-12 — End: 2015-03-12
  Administered 2015-03-12: 3 mL via RESPIRATORY_TRACT
  Filled 2015-03-12: qty 3

## 2015-03-12 MED ORDER — PREDNISONE 20 MG PO TABS
60.0000 mg | ORAL_TABLET | Freq: Once | ORAL | Status: AC
  Administered 2015-03-12: 60 mg via ORAL
  Filled 2015-03-12: qty 3

## 2015-03-12 MED ORDER — ALBUTEROL SULFATE (2.5 MG/3ML) 0.083% IN NEBU: 5.0000 mg | INHALATION_SOLUTION | Freq: Four times a day (QID) | RESPIRATORY_TRACT | Status: AC | PRN

## 2015-03-12 MED ORDER — ALBUTEROL SULFATE (2.5 MG/3ML) 0.083% IN NEBU
5.0000 mg | INHALATION_SOLUTION | Freq: Once | RESPIRATORY_TRACT | Status: AC
  Administered 2015-03-12: 5 mg via RESPIRATORY_TRACT
  Filled 2015-03-12: qty 6

## 2015-03-12 MED ORDER — AZITHROMYCIN 250 MG PO TABS: 250.0000 mg | ORAL_TABLET | Freq: Every day | ORAL | Status: DC

## 2015-03-12 MED ORDER — OXYCODONE-ACETAMINOPHEN 5-325 MG PO TABS
1.0000 | ORAL_TABLET | Freq: Once | ORAL | Status: AC
  Administered 2015-03-12: 1 via ORAL
  Filled 2015-03-12: qty 1

## 2015-03-12 MED ORDER — PREDNISONE 20 MG PO TABS: 40.0000 mg | ORAL_TABLET | Freq: Every day | ORAL | Status: DC

## 2015-03-12 NOTE — Discharge Instructions (Signed)
Take the prescribed medication as directed.  Use nebulizer treatments every 4 hours as needed for wheezing/shortness of breath. Follow-up with your primary care physician. Return to the ED for new or worsening symptoms-- high fever, worsening symptoms, chest pain, SOB, etc.

## 2015-03-12 NOTE — ED Provider Notes (Signed)
CSN: [id number]     Arrival date & time 03/12/15  1023 History   First MD Initiated Contact with Patient 03/12/15 1036     Chief Complaint  Patient presents with  . Shortness of Breath     (Consider location/radiation/quality/duration/timing/severity/associated sxs/prior Treatment) The history is provided by the patient and medical records.    This is a 69 year old female with history of back pain and COPD, presenting to the ED for 5 days of shortness of breath. Patient recently traveled to the [place] with her sister and was exposed to hay which is a known trigger of her seasonal allergies. She states since returning home she has had dry cough, shortness of breath, wheezing, and generalized fatigue. She reports subjective fever and chills. She states she does have some chest discomfort, mostly in her midsternal region. She denies any dizziness, weakness, or syncope. No abdominal pain, nausea, vomiting. Patient called PCP office today to get refill of her albuterol solution for her nebulizer machine, however PCP is out of the office until 1300 today so she came here instead.  VSS.  Past Medical History  Diagnosis Date  . Back pain   . COPD (chronic obstructive pulmonary disease)    Past Surgical History  Procedure Laterality Date  . Joint replacement    . Knee surgery Right    No family history on file. History  Substance Use Topics  . Smoking status: Former Research scientist (life sciences)  . Smokeless tobacco: Current User    Types: Snuff  . Alcohol Use: No   OB History    No data available     Review of Systems  Respiratory: Positive for cough, chest tightness, shortness of breath and wheezing.   Cardiovascular: Positive for chest pain.  All other systems reviewed and are negative.     Allergies  Morphine and related and Penicillins  Home Medications   Prior to Admission medications   Medication Sig Start Date End Date Taking? Authorizing Provider  cyclobenzaprine (FLEXERIL) 10 MG  tablet Take 0.5 tablets (5 mg total) by mouth 3 (three) times daily as needed for muscle spasms. 08/07/14   Mercedes Camprubi-Soms, PA-C  HYDROcodone-acetaminophen (NORCO/VICODIN) 5-325 MG per tablet Take 1 tablet by mouth every 4 (four) hours as needed. 06/22/14   Larene Pickett, PA-C  ibuprofen (ADVIL,MOTRIN) 800 MG tablet Take 1 tablet (800 mg total) by mouth 3 (three) times daily. 12/19/14   Tiffany Carlota Raspberry, PA-C  methocarbamol (ROBAXIN) 500 MG tablet Take 1 tablet (500 mg total) by mouth 2 (two) times daily. 06/22/14   Larene Pickett, PA-C  naproxen (NAPROSYN) 500 MG tablet Take 1 tablet (500 mg total) by mouth 2 (two) times daily as needed for mild pain, moderate pain or headache (TAKE WITH MEALS.). 08/07/14   Mercedes Camprubi-Soms, PA-C  naproxen sodium (ANAPROX) 220 MG tablet Take 220 mg by mouth 2 (two) times daily.    Historical Provider, MD  oxyCODONE-acetaminophen (PERCOCET) 5-325 MG per tablet Take 1-2 tablets by mouth every 6 (six) hours as needed for severe pain. 08/07/14   Mercedes Camprubi-Soms, PA-C   BP 165/90 mmHg  Pulse 77  Temp(Src) 97.6 F (36.4 C) (Oral)  Resp 20  SpO2 97%   Physical Exam  Constitutional: She is oriented to person, place, and time. She appears well-developed and well-nourished.  HENT:  Head: Normocephalic and atraumatic.  Right Ear: Tympanic membrane and ear canal normal.  Left Ear: Tympanic membrane and ear canal normal.  Nose: Nose normal.  Mouth/Throat: Uvula is midline,  oropharynx is clear and moist and mucous membranes are normal. No oropharyngeal exudate, posterior oropharyngeal edema, posterior oropharyngeal erythema or tonsillar abscesses.  Eyes: Conjunctivae and EOM are normal. Pupils are equal, round, and reactive to light.  Neck: Normal range of motion.  Cardiovascular: Normal rate, regular rhythm and normal heart sounds.   Pulmonary/Chest: Effort normal. She has no decreased breath sounds. She has wheezes. She has no rhonchi. She has no  rales.  Expiratory wheezes, more pronounced in right lung fields; no accessory muscle use or signs of distress; she has full complete sentences without difficulty  Abdominal: Soft. Bowel sounds are normal.  Musculoskeletal: Normal range of motion.  Neurological: She is alert and oriented to person, place, and time.  Skin: Skin is warm and dry.  Psychiatric: She has a normal mood and affect.  Nursing note and vitals reviewed.   ED Course  Procedures (including critical care time) Labs Review Labs Reviewed  CBC WITH DIFFERENTIAL/PLATELET - Abnormal; Notable for the following:    RBC 5.16 (*)    Hemoglobin 15.2 (*)    Monocytes Relative 18 (*)    Monocytes Absolute 1.2 (*)    All other components within normal limits  BASIC METABOLIC PANEL - Abnormal; Notable for the following:    CO2 20 (*)    Glucose, Bld 106 (*)    All other components within normal limits  I-STAT TROPOININ, ED    Imaging Review Dg Chest 2 View  03/12/2015   CLINICAL DATA:  Dry cough, chest pain and shortness of breath for 2 days, history COPD, former smoker  EXAM: CHEST  2 VIEW  COMPARISON:  None ; prior exam on time line in PACs from 2015 will not load for comparison  FINDINGS: Normal heart size, mediastinal contours, and pulmonary vascularity.  Emphysematous and bronchitic changes consistent with COPD.  Patchy infiltrate RIGHT lung base likely RIGHT middle lobe. Remaining lungs clear.  No pleural effusion or pneumothorax.  Bones demineralized with prior thoracolumbar spinal fixation and prior T9 vertebroplasty.  IMPRESSION: COPD changes with suspected RIGHT middle lobe pneumonia.  Followup PA and lateral chest X-ray is recommended in 3-4 weeks following trial of antibiotic therapy to ensure resolution and exclude underlying malignancy.   Electronically Signed   By: Lavonia Dana M.D.   On: 03/12/2015 12:01     EKG Interpretation   Date/Time:  Thursday March 12 2015 10:28:00 EDT Ventricular Rate:  91 PR Interval:   140 QRS Duration: 78 QT Interval:  350 QTC Calculation: 430 R Axis:   18 Text Interpretation:  Normal sinus rhythm Normal ECG Confirmed by  Alvino Chapel  MD, Ovid Curd [phone]) on 03/12/2015 10:32:43 AM      MDM   Final diagnoses:  Shortness of breath  Cough  CAP (community acquired pneumonia)   69 year old female here with cough and shortness of breath. [place] and thinks this triggered her seasonal allergies. She does have underlying COPD. Patient afebrile, nontoxic. She is in no acute respiratory distress. She does have expiratory wheezes, more pronounced in right lung fields, VSS on RA. EKG sinus rhythm without ischemic changes.  Lab work overall reassuring.  CXR with right middle lobe pneumonia.  Patient was treated in ED with nebulizer treatments, steroids with improvement of symptoms.  Her vital signs remained stable on room air and she remains without any signs of respiratory distress. Patient has had no recent hospitalizations and appears stable for outpatient treatment. Will start on azithromycin, course of prednisone.  Encouraged to use home nebulizer treatments every 4 hours as needed for shortness of breath or wheezing. Patient to follow-up with her PCP within the next week.  Discussed plan with patient, he/she acknowledged understanding and agreed with plan of care.  Return precautions given for new or worsening symptoms.  Larene Pickett, PA-C 03/12/15 [place], MD 03/14/15 [phone]

## 2015-03-12 NOTE — ED Notes (Signed)
Pt presents to department for evaluation of SOB, dry cough and fatigue. Ongoing x3 days. Respirations unlabored. Speaking complete sentences upon arrival to ED.

## 2015-04-14 ENCOUNTER — Emergency Department (HOSPITAL_COMMUNITY): Payer: Medicare Other

## 2015-04-14 ENCOUNTER — Emergency Department (HOSPITAL_COMMUNITY)
Admission: EM | Admit: 2015-04-14 | Discharge: 2015-04-14 | Disposition: A | Discharge status: Home / Self Care | Payer: Medicare Other | Attending: Emergency Medicine | Admitting: Emergency Medicine

## 2015-04-14 ENCOUNTER — Encounter (HOSPITAL_COMMUNITY): Payer: Self-pay

## 2015-04-14 DIAGNOSIS — S8002XA Contusion of left knee, initial encounter: Secondary | ICD-10-CM

## 2015-04-14 DIAGNOSIS — Z7952 Long term (current) use of systemic steroids: Secondary | ICD-10-CM | POA: Insufficient documentation

## 2015-04-14 DIAGNOSIS — S0083XA Contusion of other part of head, initial encounter: Secondary | ICD-10-CM | POA: Diagnosis not present

## 2015-04-14 DIAGNOSIS — W01198A Fall on same level from slipping, tripping and stumbling with subsequent striking against other object, initial encounter: Secondary | ICD-10-CM | POA: Insufficient documentation

## 2015-04-14 DIAGNOSIS — S0031XA Abrasion of nose, initial encounter: Secondary | ICD-10-CM | POA: Insufficient documentation

## 2015-04-14 DIAGNOSIS — Y9289 Other specified places as the place of occurrence of the external cause: Secondary | ICD-10-CM | POA: Diagnosis not present

## 2015-04-14 DIAGNOSIS — Y9389 Activity, other specified: Secondary | ICD-10-CM | POA: Diagnosis not present

## 2015-04-14 DIAGNOSIS — Z87891 Personal history of nicotine dependence: Secondary | ICD-10-CM | POA: Diagnosis not present

## 2015-04-14 DIAGNOSIS — Z791 Long term (current) use of non-steroidal anti-inflammatories (NSAID): Secondary | ICD-10-CM | POA: Insufficient documentation

## 2015-04-14 DIAGNOSIS — J449 Chronic obstructive pulmonary disease, unspecified: Secondary | ICD-10-CM | POA: Diagnosis not present

## 2015-04-14 DIAGNOSIS — Z880 Allergy status to penicillin: Secondary | ICD-10-CM | POA: Diagnosis not present

## 2015-04-14 DIAGNOSIS — Z79899 Other long term (current) drug therapy: Secondary | ICD-10-CM | POA: Diagnosis not present

## 2015-04-14 DIAGNOSIS — Y998 Other external cause status: Secondary | ICD-10-CM | POA: Diagnosis not present

## 2015-04-14 DIAGNOSIS — W19XXXA Unspecified fall, initial encounter: Secondary | ICD-10-CM

## 2015-04-14 DIAGNOSIS — Z792 Long term (current) use of antibiotics: Secondary | ICD-10-CM | POA: Diagnosis not present

## 2015-04-14 DIAGNOSIS — Z043 Encounter for examination and observation following other accident: Secondary | ICD-10-CM | POA: Diagnosis present

## 2015-04-14 LAB — CBG MONITORING, ED: GLUCOSE-CAPILLARY: 115 mg/dL — AB (ref 65–99)

## 2015-04-14 MED ORDER — OXYCODONE-ACETAMINOPHEN 5-325 MG PO TABS
2.0000 | ORAL_TABLET | Freq: Once | ORAL | Status: AC
  Administered 2015-04-14: 2 via ORAL
  Filled 2015-04-14: qty 2

## 2015-04-14 NOTE — ED Notes (Signed)
Pt. Involved in mechanical fall this AM. States she tripped and her knee gave out. She hit her R side of face on corner of object in living room. Pt. With scrape to nose, pt R cheek swollen. Pt. With scrape to L knee.

## 2015-04-14 NOTE — ED Notes (Signed)
Patient CBG was 115.

## 2015-04-14 NOTE — Discharge Instructions (Signed)
Contusion °A contusion is a deep bruise. Contusions are the result of an injury that caused bleeding under the skin. The contusion may turn blue, purple, or yellow. Minor injuries will give you a painless contusion, but more severe contusions may stay painful and swollen for a few weeks.  °CAUSES  °A contusion is usually caused by a blow, trauma, or direct force to an area of the body. °SYMPTOMS  °· Swelling and redness of the injured area. °· Bruising of the injured area. °· Tenderness and soreness of the injured area. °· Pain. °DIAGNOSIS  °The diagnosis can be made by taking a history and physical exam. An X-ray, CT scan, or MRI may be needed to determine if there were any associated injuries, such as fractures. °TREATMENT  °Specific treatment will depend on what area of the body was injured. In general, the best treatment for a contusion is resting, icing, elevating, and applying cold compresses to the injured area. Over-the-counter medicines may also be recommended for pain control. Ask your caregiver what the best treatment is for your contusion. °HOME CARE INSTRUCTIONS  °· Put ice on the injured area. °¨ Put ice in a plastic bag. °¨ Place a towel between your skin and the bag. °¨ Leave the ice on for 15-20 minutes, 3-4 times a day, or as directed by your health care provider. °· Only take over-the-counter or prescription medicines for pain, discomfort, or fever as directed by your caregiver. Your caregiver may recommend avoiding anti-inflammatory medicines (aspirin, ibuprofen, and naproxen) for 48 hours because these medicines may increase bruising. °· Rest the injured area. °· If possible, elevate the injured area to reduce swelling. °SEEK IMMEDIATE MEDICAL CARE IF:  °· You have increased bruising or swelling. °· You have pain that is getting worse. °· Your swelling or pain is not relieved with medicines. °MAKE SURE YOU:  °· Understand these instructions. °· Will watch your condition. °· Will get help right  away if you are not doing well or get worse. °Document Released: 06/22/2005 Document Revised: 09/17/2013 Document Reviewed: 07/18/2011 °[employer]® Patient Information ©2015 [employer]. This information is not intended to replace advice given to you by your health care provider. Make sure you discuss any questions you have with your health care provider. ° °

## 2015-04-14 NOTE — ED Notes (Signed)
Patient returned from CT

## 2015-04-14 NOTE — ED Provider Notes (Signed)
CSN: [id number]     Arrival date & time 04/14/15  1018 History   First MD Initiated Contact with Patient 04/14/15 1023     Chief Complaint  Patient presents with  . Fall     (Consider location/radiation/quality/duration/timing/severity/associated sxs/prior Treatment) Patient is a 69 y.o. female presenting with fall. The history is provided by the patient.  Fall This is a new problem. The current episode started less than 1 hour ago. Episode frequency: once. The problem has been resolved. Pertinent negatives include no chest pain, no abdominal pain, no headaches and no shortness of breath. Nothing aggravates the symptoms. Nothing relieves the symptoms. She has tried nothing for the symptoms. The treatment provided no relief.    Past Medical History  Diagnosis Date  . Back pain   . COPD (chronic obstructive pulmonary disease)    Past Surgical History  Procedure Laterality Date  . Joint replacement    . Knee surgery Right    No family history on file. History  Substance Use Topics  . Smoking status: Former Research scientist (life sciences)  . Smokeless tobacco: Current User    Types: Snuff  . Alcohol Use: No   OB History    No data available     Review of Systems  Constitutional: Negative for fever and fatigue.  HENT: Negative for congestion and drooling.   Eyes: Negative for pain.  Respiratory: Negative for cough and shortness of breath.   Cardiovascular: Negative for chest pain.  Gastrointestinal: Negative for nausea, vomiting, abdominal pain and diarrhea.  Genitourinary: Negative for dysuria and hematuria.  Musculoskeletal: Negative for back pain, gait problem and neck pain.  Skin: Negative for color change.  Neurological: Negative for dizziness and headaches.  Hematological: Negative for adenopathy.  Psychiatric/Behavioral: Negative for behavioral problems.  All other systems reviewed and are negative.     Allergies  Morphine and related; Penicillins; and Tape  Home Medications    Prior to Admission medications   Medication Sig Start Date End Date Taking? Authorizing Provider  albuterol (PROVENTIL) (2.5 MG/3ML) 0.083% nebulizer solution Take 6 mLs (5 mg total) by nebulization every 6 (six) hours as needed for wheezing or shortness of breath. 03/12/15   Larene Pickett, PA-C  ALPRAZolam Duanne Moron) 0.5 MG tablet Take 0.5 mg by mouth every 4 (four) hours as needed for anxiety or sleep. anxiety 01/28/15   Historical Provider, MD  amLODipine (NORVASC) 5 MG tablet Take 5 mg by mouth daily. 12/31/14   Historical Provider, MD  azithromycin (ZITHROMAX) 250 MG tablet Take 1 tablet (250 mg total) by mouth daily. Take first 2 tablets together, then 1 every day until finished. 03/12/15   Larene Pickett, PA-C  cyclobenzaprine (FLEXERIL) 10 MG tablet Take 0.5 tablets (5 mg total) by mouth 3 (three) times daily as needed for muscle spasms. 08/07/14   Mercedes Camprubi-Soms, PA-C  HYDROcodone-acetaminophen (NORCO/VICODIN) 5-325 MG per tablet Take 1 tablet by mouth every 4 (four) hours as needed. 06/22/14   Larene Pickett, PA-C  ibuprofen (ADVIL,MOTRIN) 800 MG tablet Take 1 tablet (800 mg total) by mouth 3 (three) times daily. Patient not taking: Reported on 03/12/2015 12/19/14   Delos Haring, PA-C  methocarbamol (ROBAXIN) 500 MG tablet Take 1 tablet (500 mg total) by mouth 2 (two) times daily. 06/22/14   Larene Pickett, PA-C  naproxen (NAPROSYN) 500 MG tablet Take 1 tablet (500 mg total) by mouth 2 (two) times daily as needed for mild pain, moderate pain or headache (TAKE WITH MEALS.). Patient not taking:  Reported on 03/12/2015 08/07/14   Mercedes Camprubi-Soms, PA-C  naproxen sodium (ANAPROX) 220 MG tablet Take 220 mg by mouth 2 (two) times daily.    Historical Provider, MD  oxyCODONE-acetaminophen (PERCOCET) 5-325 MG per tablet Take 1-2 tablets by mouth every 6 (six) hours as needed for severe pain. Patient not taking: Reported on 03/12/2015 08/07/14   Mercedes Camprubi-Soms, PA-C  predniSONE  (DELTASONE) 20 MG tablet Take 2 tablets (40 mg total) by mouth daily. Take 40 mg by mouth daily for 3 days, then 20mg  by mouth daily for 3 days, then 10mg  daily for 3 days 03/12/15   Larene Pickett, PA-C  traZODone (DESYREL) 50 MG tablet Take 50 mg by mouth at bedtime as needed. Sleep 01/28/15   Historical Provider, MD   BP 144/91 mmHg  Pulse 87  Temp(Src) 98.7 F (37.1 C) (Oral)  Resp 20  SpO2 96% Physical Exam  Constitutional: She is oriented to person, place, and time. She appears well-developed and well-nourished.  HENT:  Mouth/Throat: No oropharyngeal exudate.  Tympanic membranes clear bilaterally.  Abrasion to the bridge of the nose.  Mild to moderate swelling and tenderness to the right maxillary area.  No intraoral injury noted. The patient is edentulous.  Eyes: Conjunctivae and EOM are normal. Pupils are equal, round, and reactive to light.  Neck: Normal range of motion. Neck supple.  No focal vertebral tenderness.  Cardiovascular: Normal rate, regular rhythm, normal heart sounds and intact distal pulses.  Exam reveals no gallop and no friction rub.   No murmur heard. Pulmonary/Chest: Effort normal and breath sounds normal. No respiratory distress. She has no wheezes.  Abdominal: Soft. Bowel sounds are normal. There is no tenderness. There is no rebound and no guarding.  Musculoskeletal: Normal range of motion. She exhibits no edema or tenderness.  Abrasion to the left anterior knee.  Neurological: She is alert and oriented to person, place, and time.  alert, oriented x3 speech: normal in context and clarity memory: intact grossly cranial nerves II-XII: intact motor strength: full proximally and distally no involuntary movements or tremors sensation: intact to light touch diffusely  cerebellar: finger-to-nose intact gait: normal  Skin: Skin is warm and dry.  Psychiatric: She has a normal mood and affect. Her behavior is normal.  Nursing note and vitals reviewed.   ED  Course  Procedures (including critical care time) Labs Review Labs Reviewed  CBG MONITORING, ED    Imaging Review Ct Head Wo Contrast  04/14/2015   CLINICAL DATA:  Facial trauma secondary to a fall this morning at home.  EXAM: CT HEAD WITHOUT CONTRAST  CT MAXILLOFACIAL WITHOUT CONTRAST  TECHNIQUE: Multidetector CT imaging of the head and maxillofacial structures were performed using the standard protocol without intravenous contrast. Multiplanar CT image reconstructions of the maxillofacial structures were also generated.  COMPARISON:  Report of CT scan dated 04/13/2014  FINDINGS: CT HEAD FINDINGS  No mass lesion. No midline shift. No acute hemorrhage or hematoma. No extra-axial fluid collections. No evidence of acute infarction. Brain parenchyma is normal. No significant osseous abnormality. Hyperostosis frontalis interna.  CT MAXILLOFACIAL FINDINGS  There is a focal hematoma in the subcutaneous fat of the right cheek measuring 24 x 20 x 6 mm. There is bruising in the adjacent subcutaneous fat. No fractures or other significant osseous abnormalities. Chronic mucosal thickening in the left maxillary sinus. Orbits are normal. Visualized intracranial structures are normal.  IMPRESSION: 1. Soft tissue contusion and small subcutaneous hematoma in the right cheek. 2. No  acute intracranial abnormality.  No acute intracranial abnormality.   Electronically Signed   By: Lorriane Shire M.D.   On: 04/14/2015 12:03   Dg Knee Complete 4 Views Left  04/14/2015   CLINICAL DATA:  Trauma to the left knee a secondary to a fall this morning.  EXAM: LEFT KNEE - COMPLETE 4+ VIEW  COMPARISON:  08/16/2014 and 01/20/2014  FINDINGS: There is no fracture or dislocation or joint effusion. 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 There is chronic prominence of the soft tissues anterior to the patellar tendon but these are  slightly more prominent than on the prior study suggesting soft tissue contusion.  IMPRESSION: No acute abnormality. Probable soft tissue contusion anterior to the patellar tendon. Tricompartmental arthritis.   Electronically Signed   By: Lorriane Shire M.D.   On: 04/14/2015 11:28   Ct Maxillofacial Wo Cm  04/14/2015   CLINICAL DATA:  Facial trauma secondary to a fall this morning at home.  EXAM: CT HEAD WITHOUT CONTRAST  CT MAXILLOFACIAL WITHOUT CONTRAST  TECHNIQUE: Multidetector CT imaging of the head and maxillofacial structures were performed using the standard protocol without intravenous contrast. Multiplanar CT image reconstructions of the maxillofacial structures were also generated.  COMPARISON:  Report of CT scan dated 04/13/2014  FINDINGS: CT HEAD FINDINGS  No mass lesion. No midline shift. No acute hemorrhage or hematoma. No extra-axial fluid collections. No evidence of acute infarction. Brain parenchyma is normal. No significant osseous abnormality. Hyperostosis frontalis interna.  CT MAXILLOFACIAL FINDINGS  There is a focal hematoma in the subcutaneous fat of the right cheek measuring 24 x 20 x 6 mm. There is bruising in the adjacent subcutaneous fat. No fractures or other significant osseous abnormalities. Chronic mucosal thickening in the left maxillary sinus. Orbits are normal. Visualized intracranial structures are normal.  IMPRESSION: 1. Soft tissue contusion and small subcutaneous hematoma in the right cheek. 2. No acute intracranial abnormality.  No acute intracranial abnormality.   Electronically Signed   By: Lorriane Shire M.D.   On: 04/14/2015 12:03     EKG Interpretation None      MDM   Final diagnoses:  Fall  Contusion of face, initial encounter  Contusion of knee, left, initial encounter    10:50 AM 69 y.o. female w hx of copd who presents with a clinical fall from standing and facial injury which occurred prior to arrival. The patient states that her left knee gave out  while she was walking which is not uncommon. She fell forwards in her living room hitting the right side of her face on a heating stove. Questionable loss of consciousness. Currently has mostly right-sided facial pain. She is edentulous. Vital signs unremarkable here. Will get pain control and imaging.  1:04 PM: I interpreted/reviewed the labs and/or imaging which were non-contributory.  Pt continues to appear well, has pain meds at home. States tdap is UTD.  I have discussed the diagnosis/risks/treatment options with the patient and believe the pt to be eligible for discharge home to follow-up with her pcp. We also discussed returning to the ED immediately if new or worsening sx occur. We discussed the sx which are most concerning (e.g., worsening pain or HA) that necessitate immediate return. Medications administered to the patient during their visit and any new prescriptions provided to the patient are listed below.  Medications given during this visit Medications  oxyCODONE-acetaminophen (PERCOCET/ROXICET) 5-325 MG per tablet 2 tablet (  2 tablets Oral Given 04/14/15 1056)    New Prescriptions   No medications on file     Pamella Pert, MD 04/14/15 1306

## 2015-05-05 ENCOUNTER — Other Ambulatory Visit (HOSPITAL_COMMUNITY): Payer: Self-pay | Admitting: Family Medicine

## 2015-05-05 ENCOUNTER — Ambulatory Visit (HOSPITAL_COMMUNITY)
Admission: RE | Admit: 2015-05-05 | Discharge: 2015-05-05 | Disposition: A | Discharge status: Home / Self Care | Payer: Medicare Other | Attending: Emergency Medicine | Admitting: Emergency Medicine

## 2015-05-05 ENCOUNTER — Ambulatory Visit (HOSPITAL_COMMUNITY)
Admission: RE | Admit: 2015-05-05 | Discharge: 2015-05-05 | Disposition: A | Discharge status: Home / Self Care | Payer: Medicare Other | Source: Ambulatory Visit | Attending: Family Medicine | Admitting: Family Medicine

## 2015-05-05 DIAGNOSIS — J189 Pneumonia, unspecified organism: Secondary | ICD-10-CM | POA: Insufficient documentation

## 2015-05-29 ENCOUNTER — Emergency Department (HOSPITAL_COMMUNITY): Payer: Medicare Other

## 2015-05-29 ENCOUNTER — Emergency Department (HOSPITAL_COMMUNITY)
Admission: EM | Admit: 2015-05-29 | Discharge: 2015-05-29 | Disposition: A | Discharge status: Home / Self Care | Payer: Medicare Other | Attending: Emergency Medicine | Admitting: Emergency Medicine

## 2015-05-29 ENCOUNTER — Encounter (HOSPITAL_COMMUNITY): Payer: Self-pay | Admitting: *Deleted

## 2015-05-29 DIAGNOSIS — Y9289 Other specified places as the place of occurrence of the external cause: Secondary | ICD-10-CM | POA: Diagnosis not present

## 2015-05-29 DIAGNOSIS — S0501XA Injury of conjunctiva and corneal abrasion without foreign body, right eye, initial encounter: Secondary | ICD-10-CM | POA: Diagnosis not present

## 2015-05-29 DIAGNOSIS — Z87891 Personal history of nicotine dependence: Secondary | ICD-10-CM | POA: Diagnosis not present

## 2015-05-29 DIAGNOSIS — Z880 Allergy status to penicillin: Secondary | ICD-10-CM | POA: Diagnosis not present

## 2015-05-29 DIAGNOSIS — Y998 Other external cause status: Secondary | ICD-10-CM | POA: Insufficient documentation

## 2015-05-29 DIAGNOSIS — J449 Chronic obstructive pulmonary disease, unspecified: Secondary | ICD-10-CM | POA: Diagnosis not present

## 2015-05-29 DIAGNOSIS — H18891 Other specified disorders of cornea, right eye: Secondary | ICD-10-CM

## 2015-05-29 DIAGNOSIS — S0093XA Contusion of unspecified part of head, initial encounter: Secondary | ICD-10-CM | POA: Insufficient documentation

## 2015-05-29 DIAGNOSIS — W1839XA Other fall on same level, initial encounter: Secondary | ICD-10-CM | POA: Insufficient documentation

## 2015-05-29 DIAGNOSIS — Y9389 Activity, other specified: Secondary | ICD-10-CM | POA: Insufficient documentation

## 2015-05-29 DIAGNOSIS — Z79899 Other long term (current) drug therapy: Secondary | ICD-10-CM | POA: Insufficient documentation

## 2015-05-29 MED ORDER — TETRACAINE HCL 0.5 % OP SOLN
1.0000 [drp] | Freq: Once | OPHTHALMIC | Status: AC
  Administered 2015-05-29: 1 [drp] via OPHTHALMIC
  Filled 2015-05-29: qty 2

## 2015-05-29 MED ORDER — ERYTHROMYCIN 5 MG/GM OP OINT: TOPICAL_OINTMENT | OPHTHALMIC | Status: DC

## 2015-05-29 MED ORDER — FLUORESCEIN SODIUM 1 MG OP STRP
1.0000 | ORAL_STRIP | Freq: Once | OPHTHALMIC | Status: AC
  Administered 2015-05-29: 1 via OPHTHALMIC
  Filled 2015-05-29: qty 1

## 2015-05-29 NOTE — ED Notes (Signed)
Pt reports falling over a month ago, still has pain and redness to right eye, headaches and right side facial pain. Redness noted to eye, reports it feels scratchy and has blurred vision.

## 2015-05-29 NOTE — Discharge Instructions (Signed)
Return here as needed. Follow up with the eye doctor provided.  °

## 2015-05-29 NOTE — ED Provider Notes (Signed)
CSN: [id number]     Arrival date & time 05/29/15  1733 History   First MD Initiated Contact with Patient 05/29/15 1848     Chief Complaint  Patient presents with  . Fall     (Consider location/radiation/quality/duration/timing/severity/associated sxs/prior Treatment) HPI  Alexis Daniels is a 69 yo female who presents post fall on 04/14/15 with right sided facial pain and redness to right eye. She was seen in the ED on 7/19, a CT maxillofacial and CT of the head were performed, both were negative. Since this incident she has not had complete resolution of her symptoms. She has right sided facial pain with associated inflammation, right sided headache, and redness to the right eye that seems to come and go. She has taken aleve which helps, but not for long. She states that when she lies flat she see's black spots sometimes. She states her right eye burns, itches, and tears easily. She states she is unable to knit or read and this is frustrating to her. She admits to night sweats. She denies fever, night sweats.   Past Medical History  Diagnosis Date  . Back pain   . COPD (chronic obstructive pulmonary disease)    Past Surgical History  Procedure Laterality Date  . Joint replacement    . Knee surgery Right    History reviewed. No pertinent family history. Social History  Substance Use Topics  . Smoking status: Former Research scientist (life sciences)  . Smokeless tobacco: Current User    Types: Snuff  . Alcohol Use: No   OB History    No data available     Review of Systems  ROS negative unless otherwise specified by HPI.   Allergies  Morphine and related; Penicillins; and Tape  Home Medications   Prior to Admission medications   Medication Sig Start Date End Date Taking? Authorizing Provider  albuterol (PROVENTIL HFA;VENTOLIN HFA) 108 (90 BASE) MCG/ACT inhaler Inhale 2 puffs into the lungs every 6 (six) hours as needed for wheezing or shortness of breath.   Yes Historical Provider, MD  albuterol  (PROVENTIL) (2.5 MG/3ML) 0.083% nebulizer solution Take 6 mLs (5 mg total) by nebulization every 6 (six) hours as needed for wheezing or shortness of breath. 03/12/15  Yes Larene Pickett, PA-C  ALPRAZolam Duanne Moron) 0.5 MG tablet Take 0.5 mg by mouth every 4 (four) hours as needed for anxiety or sleep. anxiety 01/28/15  Yes Historical Provider, MD  amLODipine (NORVASC) 5 MG tablet Take 5 mg by mouth daily. 12/31/14  Yes Historical Provider, MD  furosemide (LASIX) 40 MG tablet Take 40 mg by mouth every other day.   Yes Historical Provider, MD  HYDROcodone-acetaminophen (NORCO) 7.5-325 MG per tablet Take one tab by mouth twice a day as needed for pain 05/25/15  Yes Historical Provider, MD  traZODone (DESYREL) 100 MG tablet Take 150 mg by mouth at bedtime.  05/06/15 06/05/15 Yes Historical Provider, MD   BP 140/94 mmHg  Pulse 68  Temp(Src) 98.8 F (37.1 C) (Oral)  Resp 18  SpO2 97% Physical Exam  Constitutional: She is oriented to person, place, and time. She appears well-developed and well-nourished. No distress.  HENT:  Head: Normocephalic. Head is with contusion.  Eyes: Pupils are equal, round, and reactive to light. Right eye exhibits no discharge and no exudate. No foreign body present in the right eye. Right conjunctiva is injected.  Slit lamp exam:      The right eye shows corneal abrasion and fluorescein uptake.    Right cornea  injected   Neck: Neck supple.  Cardiovascular: Normal rate, regular rhythm and normal heart sounds.  Exam reveals no gallop and no friction rub.   No murmur heard. Pulmonary/Chest: Effort normal and breath sounds normal. No respiratory distress.  Neurological: She is alert and oriented to person, place, and time.  Skin: Skin is warm and dry. No erythema.  Psychiatric: She has a normal mood and affect. Her behavior is normal.  Nursing note and vitals reviewed.   ED Course  Procedures (including critical care time) I have personally reviewed and evaluated these  images and lab results as part of my medical decision-making.    Patient will be referred to ophthalmology.  Told to return here as needed.  I reviewed the CT scan with her and advised her of the results  Dalia Heading, PA-C 05/29/15 2109  Milton Ferguson, MD 05/29/15 [phone]

## 2015-05-29 NOTE — ED Notes (Signed)
Pt back from CT hooked back up to monitor with a BP cuff and pulse ox

## 2016-01-18 ENCOUNTER — Encounter (HOSPITAL_COMMUNITY): Payer: Self-pay

## 2016-01-18 ENCOUNTER — Emergency Department (HOSPITAL_COMMUNITY): Payer: Medicare Other

## 2016-01-18 DIAGNOSIS — I10 Essential (primary) hypertension: Secondary | ICD-10-CM | POA: Diagnosis not present

## 2016-01-18 DIAGNOSIS — Z79899 Other long term (current) drug therapy: Secondary | ICD-10-CM | POA: Insufficient documentation

## 2016-01-18 DIAGNOSIS — Z880 Allergy status to penicillin: Secondary | ICD-10-CM | POA: Diagnosis not present

## 2016-01-18 DIAGNOSIS — R222 Localized swelling, mass and lump, trunk: Secondary | ICD-10-CM | POA: Insufficient documentation

## 2016-01-18 DIAGNOSIS — R011 Cardiac murmur, unspecified: Secondary | ICD-10-CM | POA: Diagnosis not present

## 2016-01-18 DIAGNOSIS — Z791 Long term (current) use of non-steroidal anti-inflammatories (NSAID): Secondary | ICD-10-CM | POA: Diagnosis not present

## 2016-01-18 DIAGNOSIS — Z87891 Personal history of nicotine dependence: Secondary | ICD-10-CM | POA: Diagnosis not present

## 2016-01-18 DIAGNOSIS — I209 Angina pectoris, unspecified: Secondary | ICD-10-CM | POA: Insufficient documentation

## 2016-01-18 DIAGNOSIS — J441 Chronic obstructive pulmonary disease with (acute) exacerbation: Secondary | ICD-10-CM | POA: Insufficient documentation

## 2016-01-18 DIAGNOSIS — R079 Chest pain, unspecified: Secondary | ICD-10-CM | POA: Diagnosis present

## 2016-01-18 LAB — CBC WITH DIFFERENTIAL/PLATELET
BASOS ABS: 0 10*3/uL (ref 0.0–0.1)
BASOS PCT: 0 %
EOS PCT: 1 %
Eosinophils Absolute: 0.2 10*3/uL (ref 0.0–0.7)
HCT: 45.2 % (ref 36.0–46.0)
Hemoglobin: 15 g/dL (ref 12.0–15.0)
Lymphocytes Relative: 31 %
Lymphs Abs: 3.6 10*3/uL (ref 0.7–4.0)
MCH: 28.7 pg (ref 26.0–34.0)
MCHC: 33.2 g/dL (ref 30.0–36.0)
MCV: 86.4 fL (ref 78.0–100.0)
MONO ABS: 1.1 10*3/uL — AB (ref 0.1–1.0)
Monocytes Relative: 9 %
Neutro Abs: 6.8 10*3/uL (ref 1.7–7.7)
Neutrophils Relative %: 59 %
Platelets: 320 10*3/uL (ref 150–400)
RBC: 5.23 MIL/uL — ABNORMAL HIGH (ref 3.87–5.11)
RDW: 12.2 % (ref 11.5–15.5)
WBC: 11.8 10*3/uL — ABNORMAL HIGH (ref 4.0–10.5)

## 2016-01-18 LAB — BASIC METABOLIC PANEL
ANION GAP: 13 (ref 5–15)
BUN: 11 mg/dL (ref 6–20)
CO2: 23 mmol/L (ref 22–32)
CREATININE: 0.95 mg/dL (ref 0.44–1.00)
Calcium: 10 mg/dL (ref 8.9–10.3)
Chloride: 102 mmol/L (ref 101–111)
GFR, EST NON AFRICAN AMERICAN: 60 mL/min — AB (ref >60)
Glucose, Bld: 116 mg/dL — ABNORMAL HIGH (ref 65–99)
Potassium: 3.4 mmol/L — ABNORMAL LOW (ref 3.5–5.1)
Sodium: 138 mmol/L (ref 135–145)

## 2016-01-18 LAB — I-STAT TROPONIN, ED: Troponin i, poc: 0 ng/mL (ref 0.00–0.08)

## 2016-01-18 MED ORDER — ALBUTEROL SULFATE (2.5 MG/3ML) 0.083% IN NEBU
5.0000 mg | INHALATION_SOLUTION | Freq: Once | RESPIRATORY_TRACT | Status: AC
  Administered 2016-01-18: 5 mg via RESPIRATORY_TRACT

## 2016-01-18 MED ORDER — ALBUTEROL SULFATE (2.5 MG/3ML) 0.083% IN NEBU
INHALATION_SOLUTION | RESPIRATORY_TRACT | Status: AC
  Filled 2016-01-18: qty 6

## 2016-01-18 NOTE — ED Notes (Signed)
Patient here with increased chest pain and increased shortness of breath x 2 days. Reports that her pain is worse today and feels like she is choking. To have stress test this Wednesday for this recurrent CP

## 2016-01-19 ENCOUNTER — Emergency Department (HOSPITAL_COMMUNITY): Payer: Medicare Other

## 2016-01-19 ENCOUNTER — Emergency Department (HOSPITAL_COMMUNITY)
Admission: EM | Admit: 2016-01-19 | Discharge: 2016-01-19 | Disposition: A | Discharge status: Home / Self Care | Payer: Medicare Other | Attending: Emergency Medicine | Admitting: Emergency Medicine

## 2016-01-19 DIAGNOSIS — I208 Other forms of angina pectoris: Secondary | ICD-10-CM

## 2016-01-19 DIAGNOSIS — R222 Localized swelling, mass and lump, trunk: Secondary | ICD-10-CM

## 2016-01-19 HISTORY — DX: Essential (primary) hypertension: I10

## 2016-01-19 LAB — BRAIN NATRIURETIC PEPTIDE: B Natriuretic Peptide: 16.5 pg/mL (ref 0.0–100.0)

## 2016-01-19 LAB — I-STAT TROPONIN, ED: TROPONIN I, POC: 0 ng/mL (ref 0.00–0.08)

## 2016-01-19 MED ORDER — ASPIRIN 81 MG PO CHEW
324.0000 mg | CHEWABLE_TABLET | Freq: Once | ORAL | Status: AC
Start: 2016-01-19 — End: 2016-01-19
  Administered 2016-01-19: 324 mg via ORAL
  Filled 2016-01-19: qty 4

## 2016-01-19 NOTE — ED Notes (Signed)
Lab at bedside

## 2016-01-19 NOTE — ED Notes (Signed)
Unsuccessful attempt with IV. Orders changed per MD

## 2016-01-19 NOTE — ED Notes (Signed)
EDP at bedside  

## 2016-01-19 NOTE — ED Provider Notes (Signed)
CSN: [id number]     Arrival date & time 01/18/16  1750 History  By signing my name below, I, Evelene Croon, attest that this documentation has been prepared under the direction and in the presence of Varney Biles, MD . Electronically Signed: Evelene Croon, Scribe. 01/19/2016. 2:23 AM.    Chief Complaint  Patient presents with  . Chest Pain  . Shortness of Breath    The history is provided by the patient. No language interpreter was used.     HPI Comments:  Alexis Daniels is a 70 y.o. female with a history of COPD, who presents to the Emergency Department complaining of abnormal sensation in her throat since yesterday evening; states she feels like she is choking. She has been able to swallow without difficulty. Pt reports associated intermittent central CP x "awhile" with SOB. She describes a heat patch that radiates out to her left chest and notes her SOB has worsened in recent days. She also notes cough. She denies h/o MI, CABG, DVT/PE and coronary stents. No recent stress test. Pt has appointment with cardiologist scheduled for 01/20/16. No alleviating factors noted. Pt denies recent weight loss.   Sherrian Divers- Cardiologist [facility])   Past Medical History  Diagnosis Date  . Back pain   . COPD (chronic obstructive pulmonary disease) ([place])   . Hypertension    Past Surgical History  Procedure Laterality Date  . Joint replacement    . Knee surgery Right    No family history on file. Social History  Substance Use Topics  . Smoking status: Former Research scientist (life sciences)  . Smokeless tobacco: Current User    Types: Snuff  . Alcohol Use: No   OB History    No data available     Review of Systems  10 systems reviewed and all are negative for acute change except as noted in the HPI.   Allergies  Morphine and related; Penicillins; Spinach; and Tape  Home Medications   Prior to Admission medications   Medication Sig Start Date End Date Taking? Authorizing Provider  albuterol (PROVENTIL HFA;VENTOLIN HFA)  108 (90 BASE) MCG/ACT inhaler Inhale 2 puffs into the lungs every 6 (six) hours as needed for wheezing or shortness of breath.   Yes Historical Provider, MD  albuterol (PROVENTIL) (2.5 MG/3ML) 0.083% nebulizer solution Take 6 mLs (5 mg total) by nebulization every 6 (six) hours as needed for wheezing or shortness of breath. 03/12/15  Yes Larene Pickett, PA-C  ALPRAZolam Duanne Moron) 0.5 MG tablet Take 0.5 mg by mouth at bedtime as needed for anxiety or sleep. anxiety 01/28/15  Yes Historical Provider, MD  amLODipine (NORVASC) 5 MG tablet Take 5 mg by mouth daily. 12/31/14  Yes Historical Provider, MD  meloxicam (MOBIC) 15 MG tablet Take 15 mg by mouth daily.   Yes Historical Provider, MD  erythromycin ophthalmic ointment Place a 1/2 inch ribbon of ointment into the lower eyelid. Patient not taking: Reported on 01/19/2016 05/29/15   Dalia Heading, PA-C   BP 148/88 mmHg  Pulse 79  Temp(Src) 98.4 F (36.9 C) (Oral)  Resp 21  Ht 5\' 6"  (1.676 m)  Wt 193 lb (87.544 kg)  BMI 31.17 kg/m2  SpO2 97% Physical Exam  Constitutional: She is oriented to person, place, and time. She appears well-developed and well-nourished. No distress.  HENT:  Head: Normocephalic and atraumatic.  Eyes: Conjunctivae are normal.  Neck:  No thyromegaly appreciated   Cardiovascular: Normal rate and regular rhythm.   Murmur heard. Pulmonary/Chest: Effort normal and breath  sounds normal. No respiratory distress.  Abdominal: She exhibits no distension.  Musculoskeletal: She exhibits no edema.  No pitting edema no unilateral swelling   Lymphadenopathy:    She has no cervical adenopathy.  Neurological: She is alert and oriented to person, place, and time.  Skin: Skin is warm and dry.  9x 9 cm lesion in ant chest well no fluctuance or erythema.  Positive blanching   Psychiatric: She has a normal mood and affect.  Nursing note and vitals reviewed.   ED Course  Procedures   DIAGNOSTIC STUDIES:  Oxygen Saturation is 95% on  RA, adequate by my interpretation.    COORDINATION OF CARE:  2:14 AM Discussed treatment plan with pt at bedside and pt agreed to plan.  Labs Review Labs Reviewed  CBC WITH DIFFERENTIAL/PLATELET - Abnormal; Notable for the following:    WBC 11.8 (*)    RBC 5.23 (*)    Monocytes Absolute 1.1 (*)    All other components within normal limits  BASIC METABOLIC PANEL - Abnormal; Notable for the following:    Potassium 3.4 (*)    Glucose, Bld 116 (*)    GFR calc non Af Amer 60 (*)    All other components within normal limits  BRAIN NATRIURETIC PEPTIDE  I-STAT TROPOININ, ED  I-STAT TROPOININ, ED  Randolm Idol, ED    Imaging Review Dg Chest 2 View  01/18/2016  CLINICAL DATA:  Short of breath and chest pain. Symptoms for 2 days. EXAM: CHEST  2 VIEW COMPARISON:  05/05/2015 FINDINGS: Normal heart size. Lungs clear. No pneumothorax. No pleural effusion. Thoraco lumbar fusion hardware is in place. Vertebroplasty in the mid thoracic spine is stable. IMPRESSION: No active cardiopulmonary disease. Electronically Signed   By: Marybelle Killings M.D.   On: 01/18/2016 18:58   Ct Chest Wo Contrast  01/19/2016  CLINICAL DATA:  Anterior chest wall mass EXAM: CT CHEST WITHOUT CONTRAST TECHNIQUE: Multidetector CT imaging of the chest was performed following the standard protocol without IV contrast. COMPARISON:  None available FINDINGS: THORACIC INLET/BODY WALL: Right thyroid nodule which was biopsied by ultrasound 04/14/2014. Left peritracheal nodule of the thoracic inlet measuring 17 mm. The nodule mildly high density and could reflect accessory thyroid or adenopathy. There is a palpable marker at the level of the jugular notch. No palpable chest wall mass is seen. MEDIASTINUM: Normal heart size. No pericardial effusion. Atherosclerosis, including the coronary arteries. No acute vascular abnormality. No adenopathy. LUNG WINDOWS: There is no edema, consolidation, effusion, or pneumothorax. Benign centrally  calcified nodule at the right lung base. UPPER ABDOMEN: No acute findings. OSSEOUS: Lower thoracic posterior fixation. T9 vertebroplasty. Advanced T8-9 degenerative disc disease. IMPRESSION: 1. Negative chest wall mass. 2. No evidence of acute cardiopulmonary disease. 3. 17 mm nodule at the thoracic inlet, possible adenopathy. There is a chest CT comparison from 2015 which is currently not available. Will attempt recovery and create addendum. 4. Right thyroid nodule which was biopsied in 2015. Electronically Signed   By: Monte Fantasia M.D.   On: 01/19/2016 05:56   I have personally reviewed and evaluated these images and lab results as part of my medical decision-making.   EKG Interpretation   Date/Time:  Monday January 18 2016 17:57:07 EDT Ventricular Rate:  78 PR Interval:  142 QRS Duration: 80 QT Interval:  392 QTC Calculation: 446 R Axis:   1 Text Interpretation:  Normal sinus rhythm Left ventricular hypertrophy  Abnormal ECG No acute changes No significant change since last tracing  Confirmed by Kathrynn Humble, MD, Thelma Comp [phone]) on 01/19/2016 6:17:47 AM       @5 :30: attempted ultrasound guided IV x 3 times - iv blew. Pt doesn't want any more attempts. She prefers not getting CT at all. Since the chest wall lesion is soft tissue - she does agree to CT chest w/o. She will see her PCP for the choking like sensation. If CT chest is neg, will d/c. Strict ER return precautions have been discussed, and patient is agreeing with the plan and is comfortable with the workup done and the recommendations from the ER.   MDM   Final diagnoses:  Soft tissue swelling of chest wall  Stable angina (HCC)    I personally performed the services described in this documentation, which was scribed in my presence. The recorded information has been reviewed and is accurate.  Pt comes in with cc of chest discomfort. She has had intermittent chest discomfort for several days now, and has associated dib. Chest  pain is atypical, but her HEAR score is 61 - age and risk factor. Will get trops x 2. It is reassuring, that pt has a Cards f/u and stress on Wednesday - so even if the HEAR score was to be higher, with neg trops and optimal f/u and reliable social situation - d/c anticipated and appropriate.  Pt also reports chest wall swelling. She has no hx of cancer. The lesion certainly doesn't show any signs of infection. CT chest ordered.  Pt also c/o feeling like she is choking. Sx worse when she is laying flat. Will get CT soft tissue neck. The exam of the neck doesn't reveal any hard abnormalities.   Varney Biles, MD 01/19/16 [phone]

## 2016-01-19 NOTE — Discharge Instructions (Signed)
We saw you in the ER for the chest pain/shortness of breath. All of our cardiac workup is normal, including labs, EKG and chest X-RAY are normal. We are not sure what is causing your discomfort, but we feel comfortable sending you home at this time.  The workup in the ER is not complete, and you should follow up with your primary care doctor for further evaluation.  Although the cardiac results are normal - you most definitely should see your Cardiologist as planned on Wednesday for the stress test. Please return to the ER if you have worsening chest pain, shortness of breath, pain radiating to your jaw, shoulder, or back, sweats or fainting. Otherwise see the Cardiologist or your primary care doctor as requested.  Angina Pectoris Angina pectoris, often called angina, is extreme discomfort in the chest, neck, or arm. This is caused by a lack of blood in the middle and thickest layer of the heart wall (myocardium). There are four types of angina:  Stable angina. Stable angina usually occurs in episodes of predictable frequency and duration. It is usually brought on by physical activity, stress, or excitement. Stable angina usually lasts a few minutes and can often be relieved by a medicine that you place under your tongue. This medicine is called sublingual nitroglycerin.  Unstable angina. Unstable angina can occur even when you are doing little or no physical activity. It can even occur while you are sleeping or when you are at rest. It can suddenly increase in severity or frequency. It may not be relieved by sublingual nitroglycerin, and it can last up to 30 minutes.  Microvascular angina. This type of angina is caused by a disorder of tiny blood vessels called arterioles. Microvascular angina is more common in women. The pain may be more severe and last longer than other types of angina pectoris.  Prinzmetal or variant angina. This type of angina pectoris is rare and usually occurs when you are  doing little or no physical activity. It especially occurs in the early morning hours. CAUSES Atherosclerosis is the cause of angina. This is the buildup of fat and cholesterol (plaque) on the inside of the arteries. Over time, the plaque may narrow or block the artery, and this will lessen blood flow to the heart. Plaque can also become weak and break off within a coronary artery to form a clot and cause a sudden blockage. RISK FACTORS Risk factors common to both men and women include:  High cholesterol levels.  High blood pressure (hypertension).  Tobacco use.  Diabetes.  Family history of angina.  Obesity.  Lack of exercise.  A diet high in saturated fats. Women are at greater risk for angina if they are:  Over age 67.  Postmenopausal. SYMPTOMS Many people do not experience any symptoms during the early stages of angina. As the condition progresses, symptoms common to both men and women may include:  Chest pain.  The pain can be described as a crushing or squeezing in the chest, or a tightness, pressure, fullness, or heaviness in the chest.  The pain can last more than a few minutes, or it can stop and recur.  Pain in the arms, neck, jaw, or back.  Unexplained heartburn or indigestion.  Shortness of breath.  Nausea.  Sudden cold sweats.  Sudden light-headedness. Many women have chest discomfort and some of the other symptoms. However, women often have different (atypical) symptoms, such as:   Fatigue.  Unexplained feelings of nervousness or anxiety.  Unexplained weakness.  Dizziness or fainting. Sometimes, women may have angina without any symptoms. DIAGNOSIS  Tests to diagnose angina may include:  ECG (electrocardiogram).  Exercise stress test. This looks for signs of blockage when the heart is being exercised.  Pharmacologic stress test. This test looks for signs of blockage when the heart is being stressed with a medicine.  Blood  tests.  Coronary angiogram. This is a procedure to look at the coronary arteries to see if there is any blockage. TREATMENT  The treatment of angina may include the following:  Healthy behavioral changes to reduce or control risk factors.  Medicine.  Coronary stenting.A stent helps to keep an artery open.  Coronary angioplasty. This procedure widens a narrowed or blocked artery.  Coronary arterybypass surgery. This will allow your blood to pass the blockage (bypass) to reach your heart. HOME CARE INSTRUCTIONS   Take medicines only as directed by your health care provider.  Do not take the following medicines unless your health care provider approves:  Nonsteroidal anti-inflammatory drugs (NSAIDs), such as ibuprofen, naproxen, or celecoxib.  Vitamin supplements that contain vitamin A, vitamin E, or both.  Hormone replacement therapy that contains estrogen with or without progestin.  Manage other health conditions such as hypertension and diabetes as directed by your health care provider.  Follow a heart-healthy diet. A dietitian can help to educate you about healthy food options and changes.  Use healthy cooking methods such as roasting, grilling, broiling, baking, poaching, steaming, or stir-frying. Talk to a dietitian to learn more about healthy cooking methods.  Follow an exercise program approved by your health care provider.  Maintain a healthy weight. Lose weight as approved by your health care provider.  Plan rest periods when fatigued.  Learn to manage stress.  Do not use any tobacco products, including cigarettes, chewing tobacco, or electronic cigarettes. If you need help quitting, ask your health care provider.  If you drink alcohol, and your health care provider approves, limit your alcohol intake to no more than 1 drink per day. One drink equals 12 ounces of beer, 5 ounces of wine, or 1 ounces of hard liquor.  Stop illegal drug use.  Keep all follow-up  visits as directed by your health care provider. This is important. SEEK IMMEDIATE MEDICAL CARE IF:   You have pain in your chest, neck, arm, jaw, stomach, or back that lasts more than a few minutes, is recurring, or is unrelieved by taking sublingualnitroglycerin.  You have profuse sweating without cause.  You have unexplained:  Heartburn or indigestion.  Shortness of breath or difficulty breathing.  Nausea or vomiting.  Fatigue.  Feelings of nervousness or anxiety.  Weakness.  Diarrhea.  You have sudden light-headedness or dizziness.  You faint. These symptoms may represent a serious problem that is an emergency. Do not wait to see if the symptoms will go away. Get medical help right away. Call your local emergency services (911 in the [place] Do not drive yourself to the hospital.   This information is not intended to replace advice given to you by your health care provider. Make sure you discuss any questions you have with your health care provider.   Document Released: 09/12/2005 Document Revised: 10/03/2014 Document Reviewed: 01/14/2014 Elsevier Interactive Patient Education [employer].

## 2016-01-29 IMAGING — CT CT MAXILLOFACIAL W/O CM
3 series · 16 of 47 positions shown, 19 images · non-contrast
Comparison: Head CT 04/14/2015.

CLINICAL DATA: 68-year-old female with history of trauma with fall
injury to the face. Pain around the right eye and right cheek.

EXAM:
CT MAXILLOFACIAL WITHOUT CONTRAST
TECHNIQUE: Multidetector CT imaging of the maxillofacial structures was
performed. Multiplanar CT image reconstructions were also generated.
A small metallic BB was placed on the right temple in order to
reliably differentiate right from left.

[Series 2: facial/ orbits 2.0 h30s · axial · 0.31mm/px · z∈[-144,-8]mm · 10 of 80 slices shown, 13 images]
[im 6/80  brain]
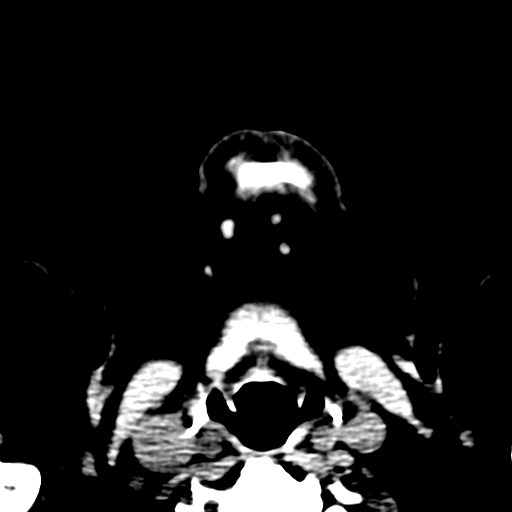
[im 6/80  bone]
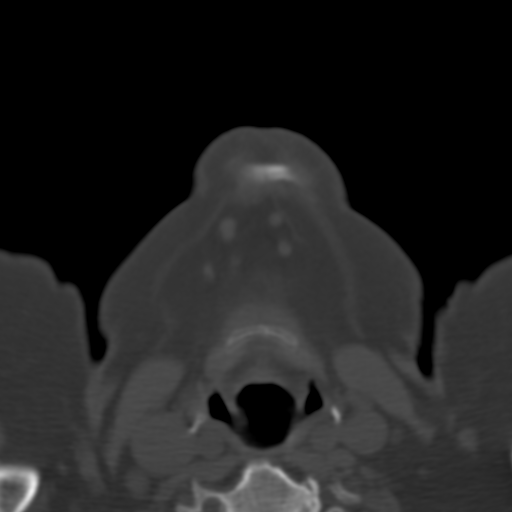
[im 14/80  bone]
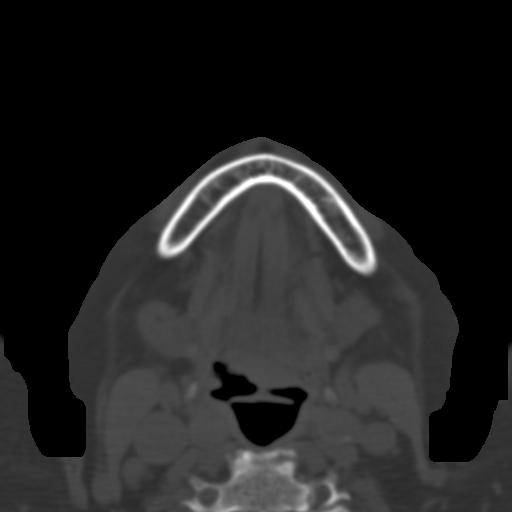
[im 22/80  bone]
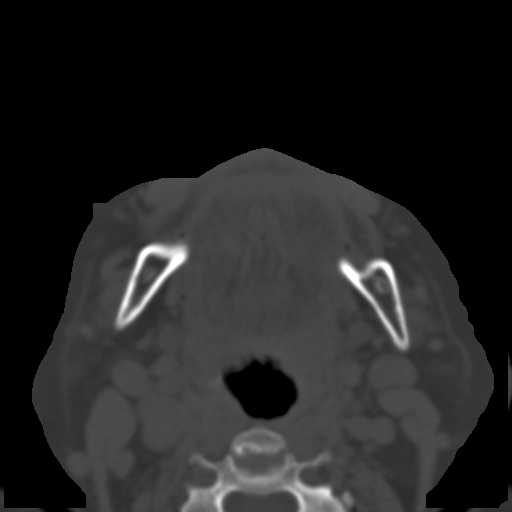
[im 28/80  bone]
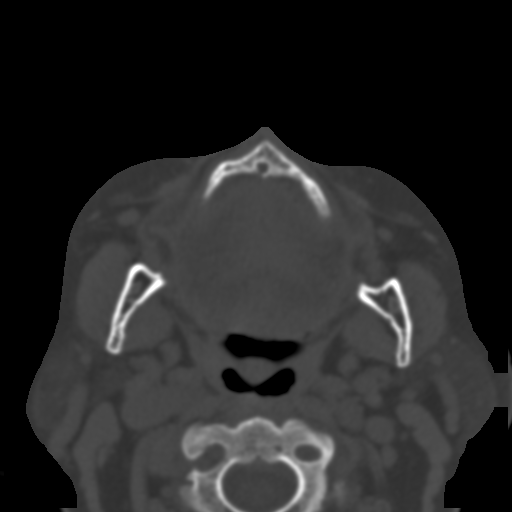
[im 36/80  brain]
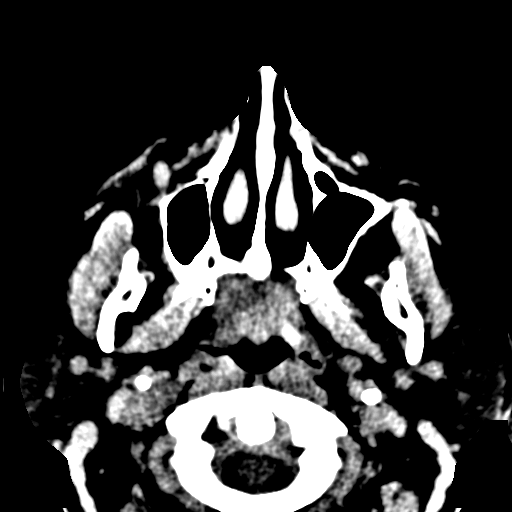
[im 36/80  bone]
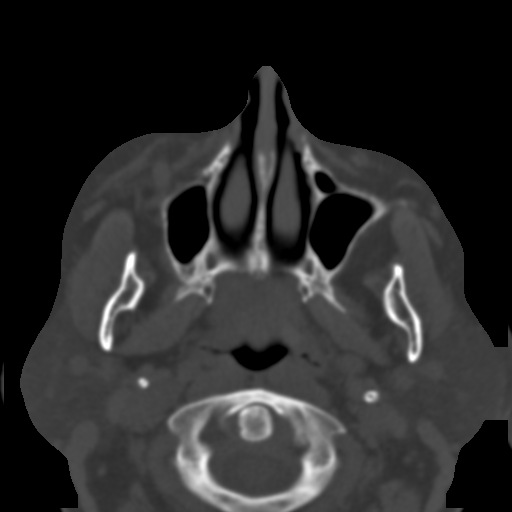
[im 44/80  bone]
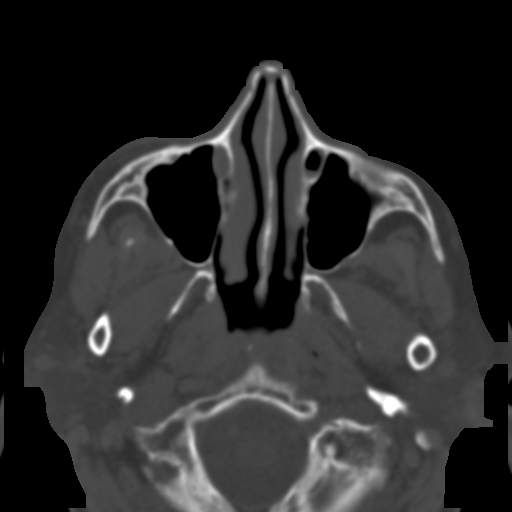
[im 52/80  bone]
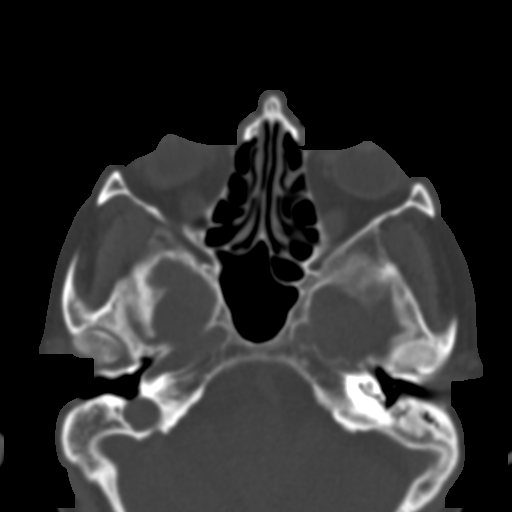
[im 60/80  bone]
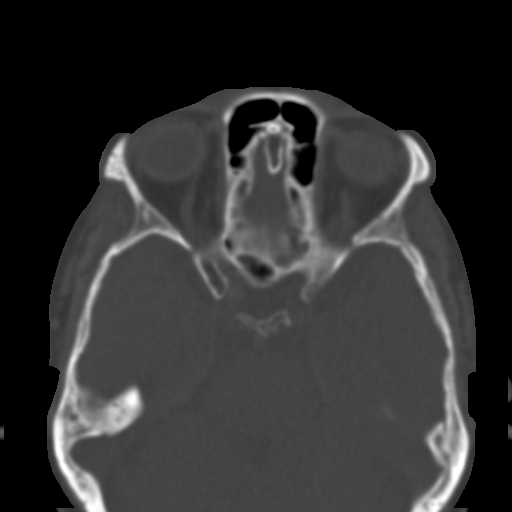
[im 66/80  brain]
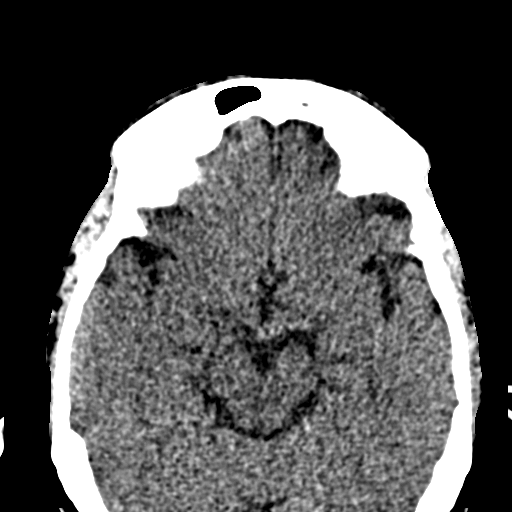
[im 66/80  bone]
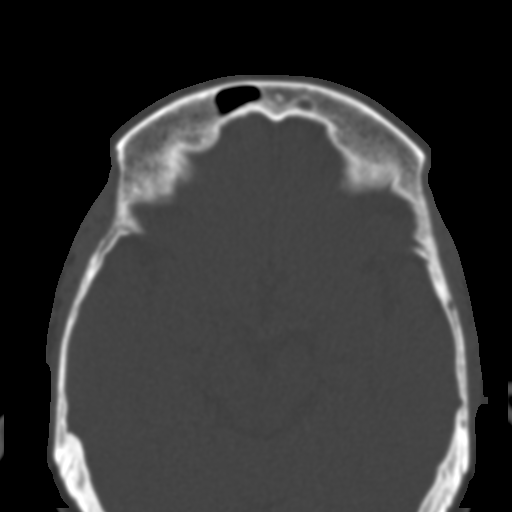
[im 74/80  bone]
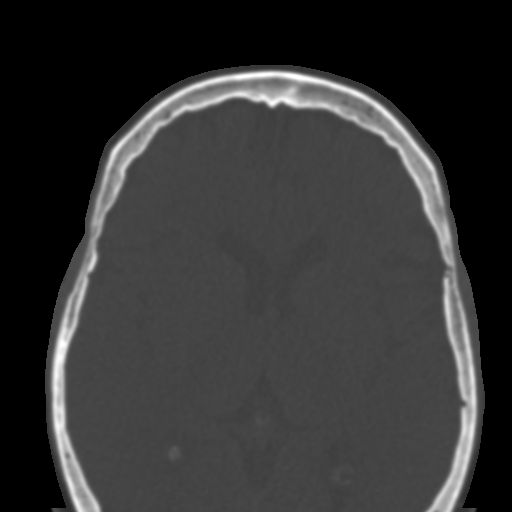

[Series 6: coronal soft tissue · coronal · 0.36mm/px · 3 of 63 slices shown]
[im 21/63  bone]
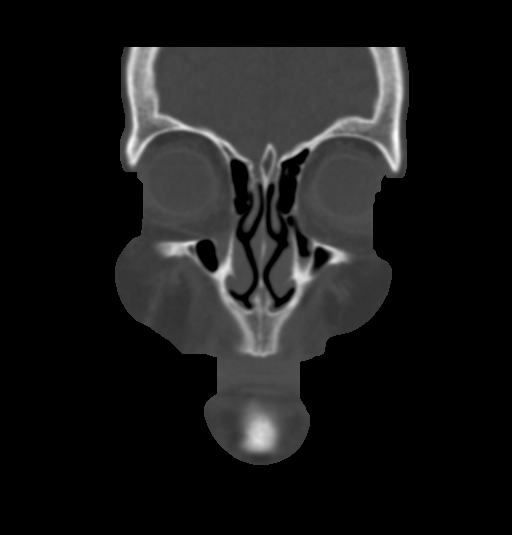
[im 28/63  bone]
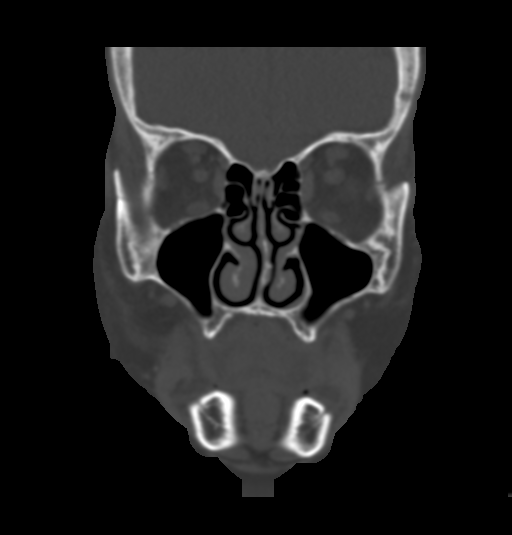
[im 35/63  bone]
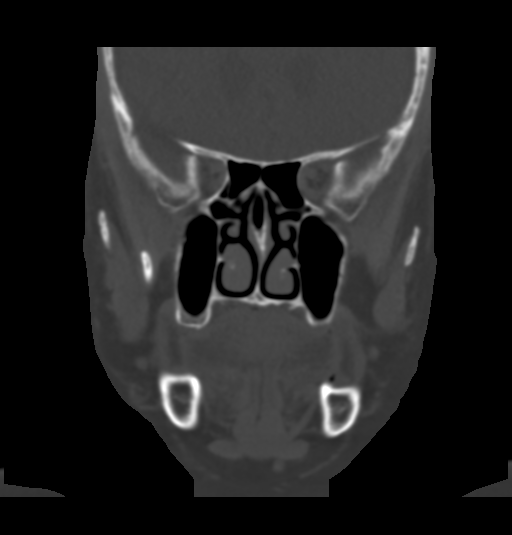

[Series 8: sagittal soft tissue · sagittal · 0.34mm/px · 3 of 71 slices shown]
[im 24/71  bone]
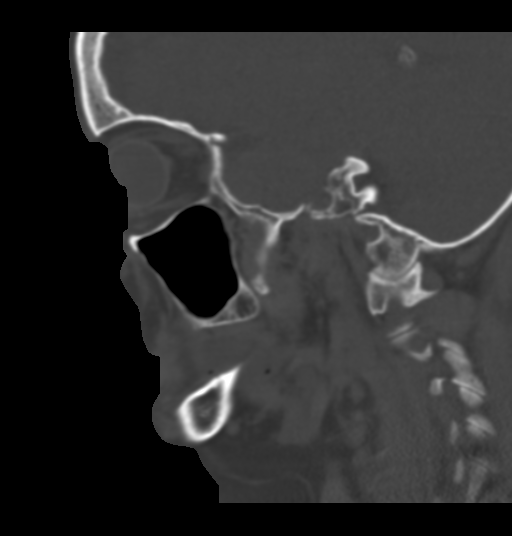
[im 36/71  bone]
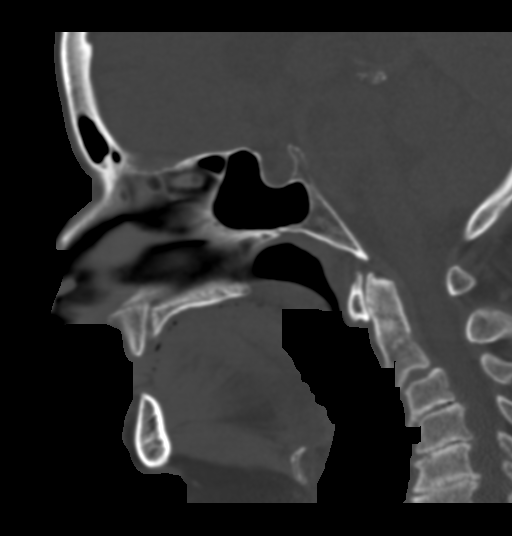
[im 47/71  bone]
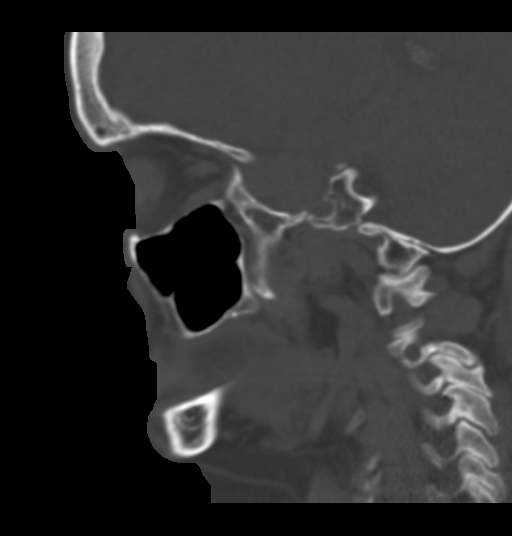

[16 of 47 positions shown; findings below may reference images not displayed]

FINDINGS: No acute displaced facial bone fractures. Specifically, pterygoid
plates are intact. Mandibular condyles are located bilaterally.
Bilateral globes and retro-orbital soft tissues are grossly normal,
and bilateral orbits are intact. Visualized intracranial contents
are remarkable for cerebral atrophy.
IMPRESSION: 1. No evidence of significant acute traumatic injury to the facial
bones.

## 2016-07-07 ENCOUNTER — Emergency Department (HOSPITAL_COMMUNITY)
Admission: EM | Admit: 2016-07-07 | Discharge: 2016-07-07 | Disposition: A | Discharge status: Home / Self Care | Payer: Medicare Other | Attending: Emergency Medicine | Admitting: Emergency Medicine

## 2016-07-07 ENCOUNTER — Emergency Department (HOSPITAL_COMMUNITY): Payer: Medicare Other

## 2016-07-07 ENCOUNTER — Encounter (HOSPITAL_COMMUNITY): Payer: Self-pay | Admitting: Emergency Medicine

## 2016-07-07 DIAGNOSIS — Z79899 Other long term (current) drug therapy: Secondary | ICD-10-CM | POA: Insufficient documentation

## 2016-07-07 DIAGNOSIS — W19XXXA Unspecified fall, initial encounter: Secondary | ICD-10-CM

## 2016-07-07 DIAGNOSIS — M25522 Pain in left elbow: Secondary | ICD-10-CM | POA: Diagnosis not present

## 2016-07-07 DIAGNOSIS — Y999 Unspecified external cause status: Secondary | ICD-10-CM | POA: Diagnosis not present

## 2016-07-07 DIAGNOSIS — M25512 Pain in left shoulder: Secondary | ICD-10-CM | POA: Diagnosis not present

## 2016-07-07 DIAGNOSIS — W108XXA Fall (on) (from) other stairs and steps, initial encounter: Secondary | ICD-10-CM | POA: Diagnosis not present

## 2016-07-07 DIAGNOSIS — M542 Cervicalgia: Secondary | ICD-10-CM | POA: Insufficient documentation

## 2016-07-07 DIAGNOSIS — J449 Chronic obstructive pulmonary disease, unspecified: Secondary | ICD-10-CM | POA: Diagnosis not present

## 2016-07-07 DIAGNOSIS — Y9389 Activity, other specified: Secondary | ICD-10-CM | POA: Diagnosis not present

## 2016-07-07 DIAGNOSIS — Y92009 Unspecified place in unspecified non-institutional (private) residence as the place of occurrence of the external cause: Secondary | ICD-10-CM | POA: Insufficient documentation

## 2016-07-07 DIAGNOSIS — R930 Abnormal findings on diagnostic imaging of skull and head, not elsewhere classified: Secondary | ICD-10-CM | POA: Insufficient documentation

## 2016-07-07 DIAGNOSIS — Z9660 Presence of unspecified orthopedic joint implant: Secondary | ICD-10-CM | POA: Insufficient documentation

## 2016-07-07 DIAGNOSIS — Z87891 Personal history of nicotine dependence: Secondary | ICD-10-CM | POA: Diagnosis not present

## 2016-07-07 DIAGNOSIS — I10 Essential (primary) hypertension: Secondary | ICD-10-CM | POA: Diagnosis not present

## 2016-07-07 DIAGNOSIS — M79602 Pain in left arm: Secondary | ICD-10-CM | POA: Diagnosis present

## 2016-07-07 DIAGNOSIS — M25519 Pain in unspecified shoulder: Secondary | ICD-10-CM

## 2016-07-07 MED ORDER — CYCLOBENZAPRINE HCL 10 MG PO TABS: 10.0000 mg | ORAL_TABLET | Freq: Two times a day (BID) | ORAL | 0 refills | Status: AC | PRN

## 2016-07-07 MED ORDER — HYDROCODONE-ACETAMINOPHEN 5-325 MG PO TABS: 1.0000 | ORAL_TABLET | ORAL | 0 refills | Status: AC | PRN

## 2016-07-07 MED ORDER — OXYCODONE-ACETAMINOPHEN 5-325 MG PO TABS
1.0000 | ORAL_TABLET | Freq: Once | ORAL | Status: AC
  Administered 2016-07-07: 1 via ORAL
  Filled 2016-07-07: qty 1

## 2016-07-07 NOTE — ED Provider Notes (Signed)
Patient seen and evaluated. Discussed with Janetta Hora PA-C.  She presents home after a fall down 5 steps after getting tangled in her dog leash. Abrasions on her knee and arm. Complaining of pain diffusely down her back where she has chronic back pain requiring twice per day hydrocodone. X-rays of the knee, elbow, spine, and CT scan of head and neck show no acute fractures or other processes. Plan anti-inflammatories, ice, muscle relaxers. Continuing her home pain medicine regimen.   Tanna Furry, MD 07/07/16 [phone]

## 2016-07-07 NOTE — ED Provider Notes (Signed)
[place] DEPT Provider Note   CSN: [id number] Arrival date & time: 07/07/16  1006   By signing my name below, I, Neta Mends, attest that this documentation has been prepared under the direction and in the presence of Janetta Hora, PA-C. Electronically Signed: Neta Mends, ED Scribe. 07/07/2016. 10:40 AM.   History   Chief Complaint Chief Complaint  Patient presents with  . Fall    The history is provided by the patient. No language interpreter was used.  HPI Comments:  Alexis Daniels is a 70 y.o. female with PMHx of HTN, COPD, degenerative joint disease, and chronic back/neck pain who presents to the Emergency Department s/p a fall that occurred PTA. Pt states that she grabbed the leash of her neighbor's dog, and the dog started to run and pulled her down 5 concrete steps. Pt states that she hit her left arm on the ground, injured her right arm trying to brace her fall, and sustained wounds to her right knee during the fall. Pt reports associated pain to all 3 of these area. Pt further reports associated neck pain, rib pain, and associated blurry vision that began immediately after the fall, though this has resolved. Pt is unsure if she hit her head during the fall. Pt has PSHx of back, left elbow, and right knee surgery. No alleviating factors noted. Pt denies LOC, abdominal pain, chest pain, SOB, bowel/bladder incontinence.   Past Medical History:  Diagnosis Date  . Back pain   . COPD (chronic obstructive pulmonary disease) ([place])   . Hypertension     There are no active problems to display for this patient.   Past Surgical History:  Procedure Laterality Date  . JOINT REPLACEMENT    . KNEE SURGERY Right     OB History    No data available       Home Medications    Prior to Admission medications   Medication Sig Start Date End Date Taking? Authorizing Provider  albuterol (PROVENTIL HFA;VENTOLIN HFA) 108 (90 BASE) MCG/ACT inhaler Inhale 2 puffs into the  lungs every 6 (six) hours as needed for wheezing or shortness of breath.    Historical Provider, MD  albuterol (PROVENTIL) (2.5 MG/3ML) 0.083% nebulizer solution Take 6 mLs (5 mg total) by nebulization every 6 (six) hours as needed for wheezing or shortness of breath. 03/12/15   Larene Pickett, PA-C  ALPRAZolam Duanne Moron) 0.5 MG tablet Take 0.5 mg by mouth at bedtime as needed for anxiety or sleep. anxiety 01/28/15   Historical Provider, MD  amLODipine (NORVASC) 5 MG tablet Take 5 mg by mouth daily. 12/31/14   Historical Provider, MD  erythromycin ophthalmic ointment Place a 1/2 inch ribbon of ointment into the lower eyelid. Patient not taking: Reported on 01/19/2016 05/29/15   Dalia Heading, PA-C  meloxicam (MOBIC) 15 MG tablet Take 15 mg by mouth daily.    Historical Provider, MD    Family History History reviewed. No pertinent family history.  Social History Social History  Substance Use Topics  . Smoking status: Former Research scientist (life sciences)  . Smokeless tobacco: Current User    Types: Snuff  . Alcohol use No     Allergies   Morphine and related; Penicillins; Spinach; and Tape   Review of Systems Review of Systems  Eyes: Positive for visual disturbance.  Respiratory: Negative for shortness of breath.   Cardiovascular: Negative for chest pain.  Gastrointestinal: Negative for abdominal pain.  Musculoskeletal: Positive for arthralgias and myalgias.  Skin: Positive for wound.  Neurological: Negative for syncope.  All other systems reviewed and are negative.    Physical Exam Updated Vital Signs BP 132/76 (BP Location: Right Arm)   Pulse 85   Temp 98.8 F (37.1 C) (Oral)   Resp 20   SpO2 98%   Physical Exam  Constitutional: She appears well-developed and well-nourished. No distress.  HENT:  Head: Normocephalic and atraumatic.  Eyes: Conjunctivae and EOM are normal. Pupils are equal, round, and reactive to light. Right eye exhibits no discharge. Left eye exhibits no discharge. No scleral  icterus.  Neck: Normal range of motion. Neck supple.  Midline tenderness noted  Cardiovascular: Normal rate.  Exam reveals no gallop and no friction rub.   No murmur heard. Pulmonary/Chest: Effort normal and breath sounds normal. No respiratory distress. She has no wheezes. She has no rales. She exhibits no tenderness.  Abdominal: She exhibits no distension. There is no tenderness.  Musculoskeletal:  Left upper extremity: No obvious swelling or deformity. Diffuse tenderness to palpation over shoulder and elbow. Exam difficult due to guarding. N/V intact.  Right knee: No obvious swelling or deformity. No tenderness to palpation. FROM. N/V intact.  Back: Inspection: No masses, deformity, or rash Palpation: Diffuse midline spinal tenderness. No paraspinal muscle tenderness. Strength: 5/5 in lower extremities and normal plantar and dorsiflexion Sensation: Intact sensation with light touch in lower extremities bilaterally Gait: Antalgic gait     Neurological: She is alert.  Skin: Skin is warm and dry.  Psychiatric: She has a normal mood and affect.  Nursing note and vitals reviewed.    ED Treatments / Results  DIAGNOSTIC STUDIES:  Oxygen Saturation is 98% on RA, normal by my interpretation.    COORDINATION OF CARE:  10:40 AM Will order x-rays of head and neck. Discussed treatment plan with pt at bedside and pt agreed to plan.   Labs (all labs ordered are listed, but only abnormal results are displayed) Labs Reviewed - No data to display  EKG  EKG Interpretation None       Radiology No results found.  Procedures Procedures (including critical care time)  Medications Ordered in ED Medications  oxyCODONE-acetaminophen (PERCOCET/ROXICET) 5-325 MG per tablet 1 tablet (1 tablet Oral Given 07/07/16 1058)     Initial Impression / Assessment and Plan / ED Course  I have reviewed the triage vital signs and the nursing notes.  Pertinent labs & imaging results that were  available during my care of the patient were reviewed by me and considered in my medical decision making (see chart for details).  Clinical Course   70 year old female presents with MSK pain after a fall. Pain treated in ED. Xrays negative for bony pathology. CT head and neck are negative. Advised continue home pain regimen and add muscle relaxer. PCP follow up. Shared visit with Dr. Jeneen Rinks. Patient is NAD, non-toxic, with stable VS. Patient is informed of clinical course, understands medical decision making process, and agrees with plan. Opportunity for questions provided and all questions answered. Return precautions given.   Final Clinical Impressions(s) / ED Diagnoses   Final diagnoses:  Shoulder pain  Fall, initial encounter    New Prescriptions Discharge Medication List as of 07/07/2016 12:48 PM    START taking these medications   Details  cyclobenzaprine (FLEXERIL) 10 MG tablet Take 1 tablet (10 mg total) by mouth 2 (two) times daily as needed for muscle spasms., Starting Thu 07/07/2016, Print    HYDROcodone-acetaminophen (NORCO/VICODIN) 5-325 MG tablet Take 1-2 tablets by  mouth every 4 (four) hours as needed., Starting Thu 07/07/2016, Print       I personally performed the services described in this documentation, which was scribed in my presence. The recorded information has been reviewed and is accurate.     Recardo Evangelist, PA-C 07/12/16 [id number]    Tanna Furry, MD 07/25/16 2111

## 2016-07-07 NOTE — ED Triage Notes (Signed)
Pt arrives via POV from home with fall down 5 concrete steps after she got tangling in dog leash and was pulled down. Pt with abrasions to right knee, left arm. No visible deformities noted, Pt MAE.

## 2016-08-26 ENCOUNTER — Encounter: Payer: Self-pay | Admitting: Physician Assistant

## 2016-08-26 NOTE — Progress Notes (Signed)
Cardiology Office Note    Date:  09/01/2016  ID:  Alexis Daniels, DOB [birth year], MRN [id number] PCP:  Bartholome Bill, MD  Cardiologist:  New to Dr. Saunders Revel   Chief Complaint: chest pain  History of Present Illness:  Alexis Daniels is a 70 y.o. female with history of HTN, COPD, obesity, back pain, ovarian cancer, sleep apnea, snuff tobacco abuse, secondhand smoke exposure who presents for evaluation of multiple cardiac complaints.  She has a history of blackout spells ever since her father was killed when she was 63 years old. She is extremely distrustful of medical professionals per her report because she feels that doctors contributed to her husband's death in 2013-01-16. She was told in 2003/01/17 the left side of her heart was enlarged. She states she saw a cardiologist in [place] over 2 years ago for fluid retention and was placed on Lasix. She denies having any kind of actual workup at that time. She had chest pain earlier this year and was referred to [facility] - the note outlines the plan to obtain copy of prior echo and to proceed with Lexiscan nuc. Apparently she had an echo there 12/2015 but we cannot access this result. The patient did not attend f/u there. She states this is because she was told she was totally fine and then she got a phone call to arrange her stress test and this was upsetting to her. She has had chest pain for over a year, constant, unrelenting. There is nothing that makes it worse or better. She does have a history of acid reflux but had bad experiences with Nexium and Zantac so she refuses to take anything for it. She is having to use her nebulizer more. There is no particular pattern to her SOB. Not worse with recumbency or exertion. She also reports passing out last week for 5 minutes in the setting of getting the news that a) her daughter was regaining custody of her children after 10 year and b) her son having a spot on his lung. Her daughter says this is not  unusual for her. She has a history of her "body shutting down" in the past during episodes of acute stress or worsening depression. She was still breathing during this episode. She came to after having a bright light shone on her. She was seated when this happened. She does have history of rare palpitations but nothing associated with these spells. She recently saw her PCP and verbalized preference to her PCP to f/u here in [place]. Labs 12/2015 showed normal BNP, troponin; BMET with K 3.4 and glucose 116, WBC 11.8, Hg 15. Family hx includes CHF/defib in mother and "heart exploding" in brother.    Past Medical History:  Diagnosis Date  . Anxiety   . Back pain   . COPD (chronic obstructive pulmonary disease) ([place])   . Depression   . Hypertension   . Ovarian cancer ([place])   . Sleep apnea   . Tobacco abuse     Past Surgical History:  Procedure Laterality Date  . HYSTERECTOMY ABDOMINAL WITH SALPINGECTOMY    . JOINT REPLACEMENT    . KNEE SURGERY Right   . OOPHORECTOMY      Current Medications: Current Outpatient Prescriptions  Medication Sig Dispense Refill  . albuterol (PROVENTIL HFA;VENTOLIN HFA) 108 (90 BASE) MCG/ACT inhaler Inhale 2 puffs into the lungs every 6 (six) hours as needed for wheezing or shortness of breath.    Marland Kitchen albuterol (PROVENTIL) (2.5 MG/3ML)  0.083% nebulizer solution Take 6 mLs (5 mg total) by nebulization every 6 (six) hours as needed for wheezing or shortness of breath. 75 mL 0  . ALPRAZolam (XANAX) 0.5 MG tablet Take 0.5 mg by mouth at bedtime as needed for anxiety or sleep. anxiety    . amLODipine (NORVASC) 5 MG tablet Take 5 mg by mouth daily.    . cyclobenzaprine (FLEXERIL) 10 MG tablet Take 1 tablet (10 mg total) by mouth 2 (two) times daily as needed for muscle spasms. 20 tablet 0  . HYDROcodone-acetaminophen (NORCO/VICODIN) 5-325 MG tablet Take 1-2 tablets by mouth every 4 (four) hours as needed. 10 tablet 0  . meloxicam (MOBIC) 15 MG tablet Take 15 mg by  mouth daily.     No current facility-administered medications for this visit.      Allergies:   Morphine and related; Penicillins; Spinach; and Tape   Social History   Social History  . Marital status: Widowed    Spouse name: N/A  . Number of children: N/A  . Years of education: N/A   Social History Main Topics  . Smoking status: Never Smoker  . Smokeless tobacco: Current User    Types: Snuff  . Alcohol use No  . Drug use: No  . Sexual activity: Not Asked   Other Topics Concern  . None   Social History Narrative  . None     Family History:  The patient's family history includes Congestive Heart Failure in her mother.   ROS:   Please see the history of present illness. All other systems are reviewed and otherwise negative.    PHYSICAL EXAM:   VS:  BP 116/86 (BP Location: Right Arm)   Pulse 84   Ht 5\' 5"  (1.651 m)   Wt 177 lb 12.8 oz (80.6 kg)   SpO2 94%   BMI 29.59 kg/m   BMI: Body mass index is 29.59 kg/m. GEN: Well nourished, well developed overweight WF, in no acute distress  HEENT: normocephalic, atraumatic. Purple hair Neck: no JVD, carotid bruits, or masses Cardiac: RRR; no murmurs, rubs, or gallops, no edema  Respiratory:  clear to auscultation bilaterally, normal work of breathing GI: soft, nontender, nondistended, + BS MS: no deformity or atrophy  Skin: warm and dry, no rash Neuro:  Alert and Oriented x 3, Strength and sensation are intact, follows commands Psych: euthymic mood, full affect  Wt Readings from Last 3 Encounters:  09/01/16 177 lb 12.8 oz (80.6 kg)  01/18/16 193 lb (87.5 kg)  12/19/14 200 lb (90.7 kg)      Studies/Labs Reviewed:   EKG:  EKG was ordered today and personally reviewed by me and demonstrates NSR 72bpm no acute ST-T changes, TWI avL  Recent Labs: 01/18/2016: BUN 11; Creatinine, Ser 0.95; Hemoglobin 15.0; Platelets 320; Potassium 3.4; Sodium 138 01/19/2016: B Natriuretic Peptide 16.5   Lipid Panel No results found  for: CHOL, TRIG, HDL, CHOLHDL, VLDL, LDLCALC, LDLDIRECT  Additional studies/ records that were reviewed today include: Summarized above.    ASSESSMENT & PLAN:   The patient was seen and examined by Dr. Saunders Revel and myself as she is a new patient to clinic.  1. Chest pain - atypical, persistent, EKG unremarkable with normal QTc. Will plan to review 2D echocardiogram from April to help guide further workup. If normal, will plan for Lexiscan nuclear stress test. If abnormal, may need LHC for definitive evaluation. Note she is also hesitant to the idea of resuming rx for acid reflux.  2. Palpitations - rare, not associated with #3. Continue observation for now. Recheck lytes and thyroid today. 3. Syncope - unusual in description, sound more like acute episodes of anxiety. No acute injury associated with this. Would be unusual for these to be arrhythmogenic if she was unresponsive for 5 minutes and still breathing without any sequelae or hospitalization required. Will f/u echocardiogram to help guide further workup. This does not sound orthostatic in nature. 4. Tobacco abuse - mostly secondhand exposure, but she also does snuff. Cessation advised. 5. HTN - controlled. She actually reports h/o higher BP recently. Continue current regimen.  Disposition: F/u with me in 6 weeks.   Medication Adjustments/Labs and Tests Ordered: Current medicines are reviewed at length with the patient today.  Concerns regarding medicines are outlined above. Medication changes, Labs and Tests ordered today are summarized above and listed in the Patient Instructions accessible in Encounters.   Raechel Ache PA-C  09/01/2016 9:28 AM    [address], [place], [place]  [address] Phone: [phone]; Fax: [phone]

## 2016-09-01 ENCOUNTER — Encounter (INDEPENDENT_AMBULATORY_CARE_PROVIDER_SITE_OTHER): Payer: Self-pay

## 2016-09-01 ENCOUNTER — Telehealth: Payer: Self-pay | Admitting: Physician Assistant

## 2016-09-01 ENCOUNTER — Ambulatory Visit (INDEPENDENT_AMBULATORY_CARE_PROVIDER_SITE_OTHER): Payer: Medicare Other | Admitting: Physician Assistant

## 2016-09-01 ENCOUNTER — Encounter: Payer: Self-pay | Admitting: Physician Assistant

## 2016-09-01 VITALS — BP 116/86 | HR 84 | Ht 65.0 in | Wt 177.8 lb

## 2016-09-01 DIAGNOSIS — R002 Palpitations: Secondary | ICD-10-CM | POA: Diagnosis not present

## 2016-09-01 DIAGNOSIS — Z720 Tobacco use: Secondary | ICD-10-CM

## 2016-09-01 DIAGNOSIS — R079 Chest pain, unspecified: Secondary | ICD-10-CM | POA: Diagnosis not present

## 2016-09-01 DIAGNOSIS — R55 Syncope and collapse: Secondary | ICD-10-CM

## 2016-09-01 DIAGNOSIS — R0602 Shortness of breath: Secondary | ICD-10-CM

## 2016-09-01 DIAGNOSIS — I10 Essential (primary) hypertension: Secondary | ICD-10-CM

## 2016-09-01 LAB — BASIC METABOLIC PANEL
BUN: 10 mg/dL (ref 7–25)
CALCIUM: 9.8 mg/dL (ref 8.6–10.4)
CO2: 16 mmol/L — AB (ref 20–31)
CREATININE: 0.92 mg/dL (ref 0.60–0.93)
Chloride: 106 mmol/L (ref 98–110)
GLUCOSE: 109 mg/dL — AB (ref 65–99)
Potassium: 4.1 mmol/L (ref 3.5–5.3)
Sodium: 139 mmol/L (ref 135–146)

## 2016-09-01 LAB — CBC
HCT: 46.4 % — ABNORMAL HIGH (ref 35.0–45.0)
Hemoglobin: 15.3 g/dL (ref 11.7–15.5)
MCH: 28.9 pg (ref 27.0–33.0)
MCHC: 33 g/dL (ref 32.0–36.0)
MCV: 87.5 fL (ref 80.0–100.0)
MPV: 11.7 fL (ref 7.5–12.5)
PLATELETS: 320 10*3/uL (ref 140–400)
RBC: 5.3 MIL/uL — ABNORMAL HIGH (ref 3.80–5.10)
RDW: 13.1 % (ref 11.0–15.0)
WBC: 9.4 10*3/uL (ref 3.8–10.8)

## 2016-09-01 LAB — MAGNESIUM: MAGNESIUM: 2.2 mg/dL (ref 1.5–2.5)

## 2016-09-01 LAB — TSH: TSH: 3.36 mIU/L

## 2016-09-01 NOTE — Telephone Encounter (Signed)
Called pt, per Melina Copa, PA-C, to let her know that she was able to review the echo results and based upon that, we will pursue with Lexiscan and 30 day event monitor.  Pt and daughter were given instructions verbally for the [facility] and advised that someone from the [facility] will call and set everything up.  They verbalized understanding.

## 2016-09-01 NOTE — Patient Instructions (Addendum)
Medication Instructions:  Your physician recommends that you continue on your current medications as directed. Please refer to the Current Medication list given to you today.   Labwork: TODAY:  BMET, MAGNESIUM, CBC, & TSH  Testing/Procedures: None ordered  Follow-Up: Your physician recommends that you schedule a follow-up appointment in: [place], PA-C  Any Other Special Instructions Will Be Listed Below (If Applicable).    If you need a refill on your cardiac medications before your next appointment, please call your pharmacy.

## 2016-09-01 NOTE — Telephone Encounter (Signed)
2D echo reviewed from 01/20/16:  Normal LV function/cavity size/wall motion EF 58% (visual EF 60-65%). Mild MAC with mild MR. Mild TR with mild pHTN.IVC is dilated with poor inspiration collapse with elevated right atrial pressure.  Per prior discuss with Dr. Saunders Revel, would recommend patient be scheduled for Lexiscan nuclear stress test Carlton Adam chosen because patient has multiple orthopedic issues which would make treadmill difficult). Please also arrange 30 day event monitor for syncope and palpitations. Thank you! Dayna Dunn PA-C

## 2016-09-01 NOTE — Addendum Note (Signed)
Addended by: Gaetano Net on: 09/01/2016 11:57 AM   Modules accepted: Orders

## 2016-09-06 ENCOUNTER — Telehealth (HOSPITAL_COMMUNITY): Payer: Self-pay | Admitting: *Deleted

## 2016-09-06 NOTE — Telephone Encounter (Signed)
Patient given detailed instructions per Myocardial Perfusion Study Information Sheet for the test on 09/08/16. Patient notified to arrive 15 minutes early and that it is imperative to arrive on time for appointment to keep from having the test rescheduled.  If you need to cancel or reschedule your appointment, please call the office within 24 hours of your appointment. Failure to do so may result in a cancellation of your appointment, and a $50 no show fee. Patient verbalized understanding. Carilyn Woolston Jacqueline    

## 2016-09-07 ENCOUNTER — Encounter: Payer: Self-pay | Admitting: Physician Assistant

## 2016-09-08 ENCOUNTER — Ambulatory Visit (HOSPITAL_COMMUNITY): Payer: Medicare Other | Attending: Cardiology

## 2016-09-08 DIAGNOSIS — R0602 Shortness of breath: Secondary | ICD-10-CM | POA: Diagnosis not present

## 2016-09-08 DIAGNOSIS — R002 Palpitations: Secondary | ICD-10-CM | POA: Diagnosis not present

## 2016-09-08 DIAGNOSIS — I10 Essential (primary) hypertension: Secondary | ICD-10-CM | POA: Insufficient documentation

## 2016-09-08 DIAGNOSIS — R079 Chest pain, unspecified: Secondary | ICD-10-CM | POA: Diagnosis not present

## 2016-09-08 LAB — MYOCARDIAL PERFUSION IMAGING
CHL CUP NUCLEAR SDS: 1
CHL CUP NUCLEAR SRS: 9
CSEPPHR: 93 {beats}/min
LV dias vol: 89 mL (ref 46–106)
LVSYSVOL: 32 mL
RATE: 0.28
Rest HR: 67 {beats}/min
SSS: 10
TID: 1.02

## 2016-09-08 MED ORDER — REGADENOSON 0.4 MG/5ML IV SOLN
0.4000 mg | Freq: Once | INTRAVENOUS | Status: AC
  Administered 2016-09-08: 0.4 mg via INTRAVENOUS

## 2016-09-08 MED ORDER — TECHNETIUM TC 99M TETROFOSMIN IV KIT
32.1000 | PACK | Freq: Once | INTRAVENOUS | Status: AC | PRN
  Administered 2016-09-08: 32.1 via INTRAVENOUS
  Filled 2016-09-08: qty 33

## 2016-09-08 MED ORDER — TECHNETIUM TC 99M TETROFOSMIN IV KIT
10.6000 | PACK | Freq: Once | INTRAVENOUS | Status: AC | PRN
  Administered 2016-09-08: 10.6 via INTRAVENOUS
  Filled 2016-09-08: qty 11

## 2016-09-12 ENCOUNTER — Ambulatory Visit (INDEPENDENT_AMBULATORY_CARE_PROVIDER_SITE_OTHER): Payer: Medicare Other

## 2016-09-12 DIAGNOSIS — R55 Syncope and collapse: Secondary | ICD-10-CM

## 2016-09-12 DIAGNOSIS — R002 Palpitations: Secondary | ICD-10-CM | POA: Diagnosis not present

## 2016-10-10 ENCOUNTER — Telehealth: Payer: Self-pay | Admitting: Physician Assistant

## 2016-10-10 ENCOUNTER — Other Ambulatory Visit: Payer: Self-pay | Admitting: Family Medicine

## 2016-10-10 DIAGNOSIS — Z1231 Encounter for screening mammogram for malignant neoplasm of breast: Secondary | ICD-10-CM

## 2016-10-10 NOTE — Telephone Encounter (Signed)
New message ° ° ° ° ° °Returning a call to the nurse to get monitor results °

## 2016-10-10 NOTE — Telephone Encounter (Signed)
The pt has been given her monitor results and she verbalized understanding.

## 2016-10-13 ENCOUNTER — Ambulatory Visit: Payer: Medicare Other | Admitting: Physician Assistant

## 2016-10-25 ENCOUNTER — Ambulatory Visit
Admission: RE | Admit: 2016-10-25 | Discharge: 2016-10-25 | Disposition: A | Discharge status: Home / Self Care | Payer: Medicare Other | Source: Ambulatory Visit | Attending: Family Medicine | Admitting: Family Medicine

## 2016-10-25 DIAGNOSIS — Z1231 Encounter for screening mammogram for malignant neoplasm of breast: Secondary | ICD-10-CM

## 2016-11-26 ENCOUNTER — Ambulatory Visit (HOSPITAL_COMMUNITY)
Admission: EM | Admit: 2016-11-26 | Discharge: 2016-11-26 | Disposition: A | Discharge status: Home / Self Care | Payer: Medicare Other | Attending: Internal Medicine | Admitting: Internal Medicine

## 2016-11-26 ENCOUNTER — Encounter (HOSPITAL_COMMUNITY): Payer: Self-pay | Admitting: Family Medicine

## 2016-11-26 DIAGNOSIS — J40 Bronchitis, not specified as acute or chronic: Secondary | ICD-10-CM

## 2016-11-26 MED ORDER — BENZONATATE 100 MG PO CAPS: 100.0000 mg | ORAL_CAPSULE | Freq: Three times a day (TID) | ORAL | 0 refills | Status: DC

## 2016-11-26 MED ORDER — AZITHROMYCIN 250 MG PO TABS: 250.0000 mg | ORAL_TABLET | Freq: Every day | ORAL | 0 refills | Status: DC

## 2016-11-26 MED ORDER — PREDNISONE 50 MG PO TABS: ORAL_TABLET | ORAL | 0 refills | Status: DC

## 2016-11-26 NOTE — ED Provider Notes (Signed)
CSN: [id number]     Arrival date & time 11/26/16  1746 History   First MD Initiated Contact with Patient 11/26/16 1844     Chief Complaint  Patient presents with  . Vomiting  . Cough  . Fever   (Consider location/radiation/quality/duration/timing/severity/associated sxs/prior Treatment) 71 year old female presents to clinic with 4-5 day history of cough, fever, muscle aches, bodyaches, loss of appetite, and fatigue. Patient's cough is described as dry, hacking, nonproductive, worse at night. Patient does not smoke, however she lives in a household with other smokers, she does have history of chronic lung disease, emphysema and COPD. He reported increase use an albuterol inhaler, and albuterol nebulizer nightly. She has no nausea, vomiting, or diarrhea.   The history is provided by the patient.    Past Medical History:  Diagnosis Date  . Anxiety   . Back pain   . COPD (chronic obstructive pulmonary disease) ([place])   . Depression   . Hypertension   . Ovarian cancer ([place])   . Sleep apnea   . Tobacco abuse    Past Surgical History:  Procedure Laterality Date  . HYSTERECTOMY ABDOMINAL WITH SALPINGECTOMY    . JOINT REPLACEMENT    . KNEE SURGERY Right   . OOPHORECTOMY     Family History  Problem Relation Age of Onset  . Congestive Heart Failure Mother   . Heart disease Brother     heart exploded  . CAD Neg Hx    Social History  Substance Use Topics  . Smoking status: Never Smoker  . Smokeless tobacco: Current User    Types: Snuff  . Alcohol use No   OB History    No data available     Review of Systems  Reason unable to perform ROS: as covered in HPI.  All other systems reviewed and are negative.   Allergies  Morphine and related; Penicillins; Spinach; and Tape  Home Medications   Prior to Admission medications   Medication Sig Start Date End Date Taking? Authorizing Provider  albuterol (PROVENTIL HFA;VENTOLIN HFA) 108 (90 BASE) MCG/ACT inhaler Inhale 2 puffs  into the lungs every 6 (six) hours as needed for wheezing or shortness of breath.    Historical Provider, MD  albuterol (PROVENTIL) (2.5 MG/3ML) 0.083% nebulizer solution Take 6 mLs (5 mg total) by nebulization every 6 (six) hours as needed for wheezing or shortness of breath. 03/12/15   Larene Pickett, PA-C  ALPRAZolam Duanne Moron) 0.5 MG tablet Take 0.5 mg by mouth at bedtime as needed for anxiety or sleep. anxiety 01/28/15   Historical Provider, MD  amLODipine (NORVASC) 5 MG tablet Take 5 mg by mouth daily. 12/31/14   Historical Provider, MD  azithromycin (ZITHROMAX) 250 MG tablet Take 1 tablet (250 mg total) by mouth daily. Take first 2 tablets together, then 1 every day until finished. 11/26/16   Barnet Glasgow, NP  benzonatate (TESSALON) 100 MG capsule Take 1 capsule (100 mg total) by mouth every 8 (eight) hours. 11/26/16   Barnet Glasgow, NP  cyclobenzaprine (FLEXERIL) 10 MG tablet Take 1 tablet (10 mg total) by mouth 2 (two) times daily as needed for muscle spasms. 07/07/16   Recardo Evangelist, PA-C  HYDROcodone-acetaminophen (NORCO/VICODIN) 5-325 MG tablet Take 1-2 tablets by mouth every 4 (four) hours as needed. 07/07/16   Recardo Evangelist, PA-C  meloxicam (MOBIC) 15 MG tablet Take 15 mg by mouth daily.    Historical Provider, MD  predniSONE (DELTASONE) 50 MG tablet Take 1 tablet daily with food  11/26/16   Barnet Glasgow, NP   Meds Ordered and Administered this Visit  Medications - No data to display  BP 152/96   Pulse 99   Temp 98.2 F (36.8 C)   Resp 18   SpO2 100%  No data found.   Physical Exam  Constitutional: She is oriented to person, place, and time. She appears well-developed and well-nourished. She does not have a sickly appearance. She does not appear ill. No distress.  HENT:  Head: Normocephalic and atraumatic.  Right Ear: Tympanic membrane and external ear normal.  Left Ear: Tympanic membrane and external ear normal.  Nose: Nose normal. Right sinus exhibits no maxillary  sinus tenderness and no frontal sinus tenderness. Left sinus exhibits no maxillary sinus tenderness and no frontal sinus tenderness.  Mouth/Throat: Uvula is midline, oropharynx is clear and moist and mucous membranes are normal. No oropharyngeal exudate.  Eyes: Pupils are equal, round, and reactive to light.  Neck: Normal range of motion. Neck supple. No JVD present.  Cardiovascular: Normal rate and regular rhythm.   Pulmonary/Chest: Effort normal. No respiratory distress. She has no wheezes. She has rhonchi in the right lower field and the left lower field.  Abdominal: Soft. Bowel sounds are normal. She exhibits no distension. There is no tenderness. There is no guarding.  Lymphadenopathy:       Head (right side): No submental, no submandibular, no tonsillar and no preauricular adenopathy present.       Head (left side): No submental, no submandibular, no tonsillar and no preauricular adenopathy present.    She has no cervical adenopathy.  Neurological: She is alert and oriented to person, place, and time.  Skin: Skin is warm and dry. Capillary refill takes less than 2 seconds. She is not diaphoretic.  Psychiatric: She has a normal mood and affect.  Nursing note and vitals reviewed.   Urgent Care Course     Procedures (including critical care time)  Labs Review Labs Reviewed - No data to display  Imaging Review No results found.     MDM   1. Bronchitis   Treating you tonight for bronchitis. Prescribed azithromycin, take 2 tablets today then one daily until finished. I have also prescribed prednisone, take one tablet daily with food, and I have prescribed Tessalon for cough 1 tablet every 8 hours as needed. Should you become febrile take Tylenol as needed every 4 hours not to exceed 4000 mg in any 24 hour period. Continue to use her albuterol inhaler/nebulizer as needed for wheezing and shortness of breath. Should your symptoms worsen, follow up with primary care provider or return  to clinic, or go to the ER as needed.      Barnet Glasgow, NP 11/26/16 1906

## 2016-11-26 NOTE — Discharge Instructions (Signed)
Treating you tonight for bronchitis. Prescribed azithromycin, take 2 tablets today then one daily until finished. I have also prescribed prednisone, take one tablet daily with food, and I have prescribed Tessalon for cough 1 tablet every 8 hours as needed. Should you become febrile take Tylenol as needed every 4 hours not to exceed 4000 mg in any 24 hour period. Continue to use her albuterol inhaler/nebulizer as needed for wheezing and shortness of breath. Should your symptoms worsen, follow up with primary care provider or return to clinic, or go to the ER as needed.

## 2016-11-26 NOTE — ED Triage Notes (Signed)
Pt here for flu like symptoms that started Tuesday.

## 2016-12-10 ENCOUNTER — Emergency Department (HOSPITAL_COMMUNITY)
Admission: EM | Admit: 2016-12-10 | Discharge: 2016-12-11 | Disposition: A | Discharge status: Home / Self Care | Payer: Medicare Other | Attending: Emergency Medicine | Admitting: Emergency Medicine

## 2016-12-10 ENCOUNTER — Encounter (HOSPITAL_COMMUNITY): Payer: Self-pay

## 2016-12-10 ENCOUNTER — Emergency Department (HOSPITAL_COMMUNITY): Payer: Medicare Other

## 2016-12-10 DIAGNOSIS — M545 Low back pain: Secondary | ICD-10-CM | POA: Diagnosis present

## 2016-12-10 DIAGNOSIS — M5442 Lumbago with sciatica, left side: Secondary | ICD-10-CM | POA: Insufficient documentation

## 2016-12-10 DIAGNOSIS — G8929 Other chronic pain: Secondary | ICD-10-CM

## 2016-12-10 DIAGNOSIS — Z8543 Personal history of malignant neoplasm of ovary: Secondary | ICD-10-CM | POA: Insufficient documentation

## 2016-12-10 DIAGNOSIS — Z79899 Other long term (current) drug therapy: Secondary | ICD-10-CM | POA: Diagnosis not present

## 2016-12-10 DIAGNOSIS — J449 Chronic obstructive pulmonary disease, unspecified: Secondary | ICD-10-CM | POA: Insufficient documentation

## 2016-12-10 DIAGNOSIS — I10 Essential (primary) hypertension: Secondary | ICD-10-CM | POA: Diagnosis not present

## 2016-12-10 DIAGNOSIS — M5441 Lumbago with sciatica, right side: Secondary | ICD-10-CM | POA: Insufficient documentation

## 2016-12-10 DIAGNOSIS — Z96659 Presence of unspecified artificial knee joint: Secondary | ICD-10-CM | POA: Insufficient documentation

## 2016-12-10 MED ORDER — KETOROLAC TROMETHAMINE 60 MG/2ML IM SOLN
60.0000 mg | Freq: Once | INTRAMUSCULAR | Status: AC
  Administered 2016-12-10: 60 mg via INTRAMUSCULAR
  Filled 2016-12-10: qty 2

## 2016-12-10 MED ORDER — ACETAMINOPHEN 500 MG PO TABS
1000.0000 mg | ORAL_TABLET | Freq: Once | ORAL | Status: AC
Start: 2016-12-10 — End: 2016-12-10
  Administered 2016-12-10: 1000 mg via ORAL
  Filled 2016-12-10: qty 2

## 2016-12-10 MED ORDER — OXYCODONE HCL 5 MG PO TABS
10.0000 mg | ORAL_TABLET | Freq: Once | ORAL | Status: AC
  Administered 2016-12-10: 10 mg via ORAL
  Filled 2016-12-10: qty 2

## 2016-12-10 MED ORDER — DIAZEPAM 5 MG PO TABS
5.0000 mg | ORAL_TABLET | Freq: Once | ORAL | Status: AC
  Administered 2016-12-10: 5 mg via ORAL
  Filled 2016-12-10: qty 1

## 2016-12-10 NOTE — ED Triage Notes (Signed)
Pt complaining of back pain and bilateral leg pain x 1 week. Pt states hx of back surgery. Pt denies any injury/trauma. Pt states took home pain meds, no relief.

## 2016-12-10 NOTE — ED Provider Notes (Signed)
[facility] Provider Note   CSN: [id number] Arrival date & time: 12/10/16  2130     History   Chief Complaint Chief Complaint  Patient presents with  . Back Pain  . Leg Pain    HPI Alexis Daniels is a 71 y.o. female.  71 yo F with a cc of low back pain.  Chronic pain, feels like its worsened over the past week.  Denies injury, denies loss of bowel or bladder.  Denies weakness or numbness.  Denies fever.  The patient has radiation down both her legs. This is a chronic issue.    The history is provided by the patient.  Back Pain   This is a new problem. The current episode started more than 1 week ago. The problem occurs constantly. The problem has not changed since onset.The pain is associated with no known injury. The pain is present in the lumbar spine. The quality of the pain is described as stabbing and shooting. The pain does not radiate. The pain is at a severity of 7/10. The pain is moderate. The symptoms are aggravated by bending, twisting and certain positions. Associated symptoms include leg pain. Pertinent negatives include no chest pain, no fever, no headaches and no dysuria. She has tried nothing for the symptoms. The treatment provided no relief.  Leg Pain      Past Medical History:  Diagnosis Date  . Anxiety   . Back pain   . COPD (chronic obstructive pulmonary disease) ([place])   . Depression   . Hypertension   . Ovarian cancer ([place])   . Sleep apnea   . Tobacco abuse     There are no active problems to display for this patient.   Past Surgical History:  Procedure Laterality Date  . HYSTERECTOMY ABDOMINAL WITH SALPINGECTOMY    . JOINT REPLACEMENT    . KNEE SURGERY Right   . OOPHORECTOMY      OB History    No data available       Home Medications    Prior to Admission medications   Medication Sig Start Date End Date Taking? Authorizing Provider  albuterol (PROVENTIL HFA;VENTOLIN HFA) 108 (90 BASE) MCG/ACT inhaler Inhale 2 puffs into the  lungs every 6 (six) hours as needed for wheezing or shortness of breath.    Historical Provider, MD  albuterol (PROVENTIL) (2.5 MG/3ML) 0.083% nebulizer solution Take 6 mLs (5 mg total) by nebulization every 6 (six) hours as needed for wheezing or shortness of breath. 03/12/15   Larene Pickett, PA-C  ALPRAZolam Duanne Moron) 0.5 MG tablet Take 0.5 mg by mouth at bedtime as needed for anxiety or sleep. anxiety 01/28/15   Historical Provider, MD  amLODipine (NORVASC) 5 MG tablet Take 5 mg by mouth daily. 12/31/14   Historical Provider, MD  azithromycin (ZITHROMAX) 250 MG tablet Take 1 tablet (250 mg total) by mouth daily. Take first 2 tablets together, then 1 every day until finished. 11/26/16   Barnet Glasgow, NP  benzonatate (TESSALON) 100 MG capsule Take 1 capsule (100 mg total) by mouth every 8 (eight) hours. 11/26/16   Barnet Glasgow, NP  cyclobenzaprine (FLEXERIL) 10 MG tablet Take 1 tablet (10 mg total) by mouth 2 (two) times daily as needed for muscle spasms. 07/07/16   Recardo Evangelist, PA-C  HYDROcodone-acetaminophen (NORCO/VICODIN) 5-325 MG tablet Take 1-2 tablets by mouth every 4 (four) hours as needed. 07/07/16   Recardo Evangelist, PA-C  meloxicam (MOBIC) 15 MG tablet Take 15 mg by  mouth daily.    Historical Provider, MD  predniSONE (DELTASONE) 50 MG tablet Take 1 tablet daily with food 11/26/16   Barnet Glasgow, NP    Family History Family History  Problem Relation Age of Onset  . Congestive Heart Failure Mother   . Heart disease Brother     heart exploded  . CAD Neg Hx     Social History Social History  Substance Use Topics  . Smoking status: Never Smoker  . Smokeless tobacco: Current User    Types: Snuff  . Alcohol use No     Allergies   Morphine and related; Penicillins; Spinach; and Tape   Review of Systems Review of Systems  Constitutional: Negative for chills and fever.  HENT: Negative for congestion and rhinorrhea.   Eyes: Negative for redness and visual disturbance.    Respiratory: Negative for shortness of breath and wheezing.   Cardiovascular: Negative for chest pain and palpitations.  Gastrointestinal: Negative for nausea and vomiting.  Genitourinary: Negative for dysuria and urgency.  Musculoskeletal: Positive for back pain. Negative for arthralgias and myalgias.  Skin: Negative for pallor and wound.  Neurological: Negative for dizziness and headaches.     Physical Exam Updated Vital Signs BP 121/75   Pulse 89   Temp 97.9 F (36.6 C) (Oral)   Resp 16   SpO2 95%   Physical Exam  Constitutional: She is oriented to person, place, and time. She appears well-developed and well-nourished. No distress.  HENT:  Head: Normocephalic and atraumatic.  Eyes: EOM are normal. Pupils are equal, round, and reactive to light.  Neck: Normal range of motion. Neck supple.  Cardiovascular: Normal rate and regular rhythm.  Exam reveals no gallop and no friction rub.   No murmur heard. Pulmonary/Chest: Effort normal. She has no wheezes. She has no rales.  Abdominal: Soft. She exhibits no distension. There is no tenderness.  Musculoskeletal: She exhibits tenderness (TTP lower L spine.  ). She exhibits no edema.  PMS intact distally to BLE  Neurological: She is alert and oriented to person, place, and time.  Skin: Skin is warm and dry. She is not diaphoretic.  Psychiatric: She has a normal mood and affect. Her behavior is normal.  Nursing note and vitals reviewed.    ED Treatments / Results  Labs (all labs ordered are listed, but only abnormal results are displayed) Labs Reviewed - No data to display  EKG  EKG Interpretation None       Radiology Dg Lumbar Spine Complete  Result Date: 12/10/2016 CLINICAL DATA:  Chronic low back pain. Lower back surgeries. Cramping and pain radiating down both legs, numbing sensation down back of legs. EXAM: LUMBAR SPINE - COMPLETE 4+ VIEW COMPARISON:  07/07/2016 FINDINGS: Postoperative changes with posterior rod and  screw fixation from T10 through the sacrum. Intervertebral disc prostheses at L2-3, L3-4, L4-5, and L5-S1 levels. Vertebral compression post kyphoplasty at T9. Diffuse bone demineralization. Normal alignment of the lumbar spine. No acute bone lesion or bone destruction demonstrated. No significant changes since prior study. IMPRESSION: Postoperative changes with posterior fixation from T10 through the sacrum. Post kyphoplasty of vertebral compression at T9. No acute bony abnormalities identified. Electronically Signed   By: Lucienne Capers M.D.   On: 12/10/2016 23:36    Procedures Procedures (including critical care time)  Medications Ordered in ED Medications  acetaminophen (TYLENOL) tablet 1,000 mg (1,000 mg Oral Given 12/10/16 2238)  oxyCODONE (Oxy IR/ROXICODONE) immediate release tablet 10 mg (10 mg Oral Given 12/10/16 2238)  ketorolac (TORADOL) injection 60 mg (60 mg Intramuscular Given 12/10/16 2238)  diazepam (VALIUM) tablet 5 mg (5 mg Oral Given 12/10/16 2238)     Initial Impression / Assessment and Plan / ED Course  I have reviewed the triage vital signs and the nursing notes.  Pertinent labs & imaging results that were available during my care of the patient were reviewed by me and considered in my medical decision making (see chart for details).     71 yo F With chronic back pain worsening over the past week. Patient denies injury due to advanced ADL obtain a plain film. Viewed by me without significant finding. Given symptomatic treatment here, will follow up with PCP.   11:48 PM:  I have discussed the diagnosis/risks/treatment options with the patient and family and believe the pt to be eligible for discharge home to follow-up with PCP. We also discussed returning to the ED immediately if new or worsening sx occur. We discussed the sx which are most concerning (e.g., sudden worsening pain, fever, inability to tolerate by mouth) that necessitate immediate return. Medications  administered to the patient during their visit and any new prescriptions provided to the patient are listed below.  Medications given during this visit Medications  acetaminophen (TYLENOL) tablet 1,000 mg (1,000 mg Oral Given 12/10/16 2238)  oxyCODONE (Oxy IR/ROXICODONE) immediate release tablet 10 mg (10 mg Oral Given 12/10/16 2238)  ketorolac (TORADOL) injection 60 mg (60 mg Intramuscular Given 12/10/16 2238)  diazepam (VALIUM) tablet 5 mg (5 mg Oral Given 12/10/16 2238)     The patient appears reasonably screen and/or stabilized for discharge and I doubt any other medical condition or other [facility] requiring further screening, evaluation, or treatment in the ED at this time prior to discharge.   Final Clinical Impressions(s) / ED Diagnoses   Final diagnoses:  Chronic bilateral low back pain with bilateral sciatica    New Prescriptions Discharge Medication List as of 12/10/2016 11:39 PM       Deno Etienne, DO 12/11/16 2348

## 2016-12-10 NOTE — Discharge Instructions (Signed)

## 2016-12-10 NOTE — ED Notes (Signed)
Patient transported to X-ray 

## 2019-09-06 ENCOUNTER — Emergency Department (HOSPITAL_COMMUNITY)
Admission: EM | Admit: 2019-09-06 | Discharge: 2019-09-07 | Disposition: A | Discharge status: Home / Self Care | Payer: Medicare Other | Attending: Emergency Medicine | Admitting: Emergency Medicine

## 2019-09-06 ENCOUNTER — Encounter (HOSPITAL_COMMUNITY): Payer: Self-pay | Admitting: *Deleted

## 2019-09-06 ENCOUNTER — Other Ambulatory Visit: Payer: Self-pay

## 2019-09-06 DIAGNOSIS — I10 Essential (primary) hypertension: Secondary | ICD-10-CM | POA: Insufficient documentation

## 2019-09-06 DIAGNOSIS — R110 Nausea: Secondary | ICD-10-CM | POA: Diagnosis not present

## 2019-09-06 DIAGNOSIS — M546 Pain in thoracic spine: Secondary | ICD-10-CM | POA: Diagnosis not present

## 2019-09-06 DIAGNOSIS — R109 Unspecified abdominal pain: Secondary | ICD-10-CM | POA: Insufficient documentation

## 2019-09-06 DIAGNOSIS — Z79899 Other long term (current) drug therapy: Secondary | ICD-10-CM | POA: Insufficient documentation

## 2019-09-06 DIAGNOSIS — R0602 Shortness of breath: Secondary | ICD-10-CM | POA: Insufficient documentation

## 2019-09-06 DIAGNOSIS — Z9660 Presence of unspecified orthopedic joint implant: Secondary | ICD-10-CM | POA: Diagnosis not present

## 2019-09-06 DIAGNOSIS — F17220 Nicotine dependence, chewing tobacco, uncomplicated: Secondary | ICD-10-CM | POA: Diagnosis not present

## 2019-09-06 DIAGNOSIS — J449 Chronic obstructive pulmonary disease, unspecified: Secondary | ICD-10-CM | POA: Diagnosis not present

## 2019-09-06 DIAGNOSIS — Z8543 Personal history of malignant neoplasm of ovary: Secondary | ICD-10-CM | POA: Diagnosis not present

## 2019-09-06 LAB — CBC
HCT: 47.6 % — ABNORMAL HIGH (ref 36.0–46.0)
Hemoglobin: 15.4 g/dL — ABNORMAL HIGH (ref 12.0–15.0)
MCH: 29.8 pg (ref 26.0–34.0)
MCHC: 32.4 g/dL (ref 30.0–36.0)
MCV: 92.1 fL (ref 80.0–100.0)
Platelets: 359 10*3/uL (ref 150–400)
RBC: 5.17 MIL/uL — ABNORMAL HIGH (ref 3.87–5.11)
RDW: 12.3 % (ref 11.5–15.5)
WBC: 11.9 10*3/uL — ABNORMAL HIGH (ref 4.0–10.5)
nRBC: 0 % (ref 0.0–0.2)

## 2019-09-06 LAB — BASIC METABOLIC PANEL
Anion gap: 11 (ref 5–15)
BUN: 12 mg/dL (ref 8–23)
CO2: 20 mmol/L — ABNORMAL LOW (ref 22–32)
Calcium: 9.7 mg/dL (ref 8.9–10.3)
Chloride: 107 mmol/L (ref 98–111)
Creatinine, Ser: 0.86 mg/dL (ref 0.44–1.00)
GFR calc Af Amer: >60 mL/min (ref >60)
GFR calc non Af Amer: >60 mL/min (ref >60)
Glucose, Bld: 92 mg/dL (ref 70–99)
Potassium: 4.2 mmol/L (ref 3.5–5.1)
Sodium: 138 mmol/L (ref 135–145)

## 2019-09-06 LAB — URINALYSIS, ROUTINE W REFLEX MICROSCOPIC
Bilirubin Urine: NEGATIVE
Glucose, UA: NEGATIVE mg/dL
Hgb urine dipstick: NEGATIVE
Ketones, ur: NEGATIVE mg/dL
Leukocytes,Ua: NEGATIVE
Nitrite: NEGATIVE
Protein, ur: NEGATIVE mg/dL
Specific Gravity, Urine: 1.024 (ref 1.005–1.030)
pH: 5 (ref 5.0–8.0)

## 2019-09-06 NOTE — ED Triage Notes (Signed)
Pt with right upper flank pain that radiates around to the front side, associated with nausea. Denies urinary problems or cough. About 4 hours ago she took a muscle relaxer and hydrocodone without any relief.

## 2019-09-07 ENCOUNTER — Emergency Department (HOSPITAL_COMMUNITY): Payer: Medicare Other

## 2019-09-07 LAB — HEPATIC FUNCTION PANEL
ALT: 15 U/L (ref 0–44)
AST: 18 U/L (ref 15–41)
Albumin: 3.8 g/dL (ref 3.5–5.0)
Alkaline Phosphatase: 91 U/L (ref 38–126)
Bilirubin, Direct: 0.1 mg/dL (ref 0.0–0.2)
Indirect Bilirubin: 0.6 mg/dL (ref 0.3–0.9)
Total Bilirubin: 0.7 mg/dL (ref 0.3–1.2)
Total Protein: 7.1 g/dL (ref 6.5–8.1)

## 2019-09-07 LAB — LIPASE, BLOOD: Lipase: 32 U/L (ref 11–51)

## 2019-09-07 LAB — D-DIMER, QUANTITATIVE: D-Dimer, Quant: 0.65 ug{FEU}/mL — ABNORMAL HIGH (ref 0.00–0.50)

## 2019-09-07 MED ORDER — FENTANYL CITRATE (PF) 100 MCG/2ML IJ SOLN
50.0000 ug | Freq: Once | INTRAMUSCULAR | Status: AC
  Administered 2019-09-07: 04:00:00 50 ug via INTRAVENOUS
  Filled 2019-09-07: qty 2

## 2019-09-07 NOTE — Discharge Instructions (Addendum)
You may use over-the-counter Motrin (Ibuprofen), Acetaminophen (Tylenol), topical muscle creams such as SalonPas, Icy Hot, Bengay, etc. Please stretch, apply heat, and have massage therapy for additional assistance. ° °

## 2019-09-07 NOTE — ED Provider Notes (Signed)
[facility] EMERGENCY DEPARTMENT Provider Note  CSN: [id number] Arrival date & time: 09/06/19 2046  Chief Complaint(s) No chief complaint on file.  HPI Alexis Daniels is a 73 y.o. female with a history of chronic back pain, COPD not on oxygen presents to the emergency department with right flank pain that began today.  She reports waking up with the pain.  Pain is moderate to severe and aching in nature.  Worse with movement and palpation as well as deep breathing.  She denies any associated chest pain.  No shortness of breath.  No nausea or vomiting.  No abdominal pain.  Denies any trauma.  HPI  Past Medical History Past Medical History:  Diagnosis Date  . Anxiety   . Back pain   . COPD (chronic obstructive pulmonary disease) ([place])   . Depression   . Hypertension   . Ovarian cancer ([place])   . Sleep apnea   . Tobacco abuse    There are no problems to display for this patient.  Home Medication(s) Prior to Admission medications   Medication Sig Start Date End Date Taking? Authorizing Provider  albuterol (PROVENTIL HFA;VENTOLIN HFA) 108 (90 BASE) MCG/ACT inhaler Inhale 2 puffs into the lungs every 6 (six) hours as needed for wheezing or shortness of breath.    [provider]  albuterol (PROVENTIL) (2.5 MG/3ML) 0.083% nebulizer solution Take 6 mLs (5 mg total) by nebulization every 6 (six) hours as needed for wheezing or shortness of breath. 03/12/15   Larene Pickett, PA-C  ALPRAZolam Duanne Moron) 0.5 MG tablet Take 0.5 mg by mouth at bedtime as needed for anxiety or sleep. anxiety 01/28/15   [provider]  amLODipine (NORVASC) 5 MG tablet Take 5 mg by mouth daily. 12/31/14   [provider]  azithromycin (ZITHROMAX) 250 MG tablet Take 1 tablet (250 mg total) by mouth daily. Take first 2 tablets together, then 1 every day until finished. 11/26/16   Barnet Glasgow, NP  benzonatate (TESSALON) 100 MG capsule Take 1 capsule (100 mg total) by mouth every  8 (eight) hours. 11/26/16   Barnet Glasgow, NP  cyclobenzaprine (FLEXERIL) 10 MG tablet Take 1 tablet (10 mg total) by mouth 2 (two) times daily as needed for muscle spasms. 07/07/16   Recardo Evangelist, PA-C  HYDROcodone-acetaminophen (NORCO/VICODIN) 5-325 MG tablet Take 1-2 tablets by mouth every 4 (four) hours as needed. 07/07/16   Recardo Evangelist, PA-C  meloxicam (MOBIC) 15 MG tablet Take 15 mg by mouth daily.    [provider]  predniSONE (DELTASONE) 50 MG tablet Take 1 tablet daily with food 11/26/16   Barnet Glasgow, NP                                                                                                                                    Past Surgical History Past Surgical History:  Procedure Laterality Date  . HYSTERECTOMY ABDOMINAL WITH  SALPINGECTOMY    . JOINT REPLACEMENT    . KNEE SURGERY Right   . OOPHORECTOMY     Family History Family History  Problem Relation Age of Onset  . Congestive Heart Failure Mother   . Heart disease Brother        heart exploded  . CAD Neg Hx     Social History Social History   Tobacco Use  . Smoking status: Never Smoker  . Smokeless tobacco: Current User    Types: Snuff  Substance Use Topics  . Alcohol use: No  . Drug use: No   Allergies Morphine and related, Penicillins, Spinach, and Tape  Review of Systems Review of Systems All other systems are reviewed and are negative for acute change except as noted in the HPI  Physical Exam Vital Signs  I have reviewed the triage vital signs BP (!) 162/98 (BP Location: Right Wrist)   Pulse 70   Temp 98.1 F (36.7 C) (Oral)   Resp 20   SpO2 97%   Physical Exam Vitals reviewed.  Constitutional:      General: She is not in acute distress.    Appearance: She is well-developed. She is not diaphoretic.  HENT:     Head: Normocephalic and atraumatic.     Nose: Nose normal.  Eyes:     General: No scleral icterus.       Right eye: No discharge.        Left  eye: No discharge.     Conjunctiva/sclera: Conjunctivae normal.     Pupils: Pupils are equal, round, and reactive to light.  Cardiovascular:     Rate and Rhythm: Normal rate and regular rhythm.     Heart sounds: No murmur. No friction rub. No gallop.   Pulmonary:     Effort: Pulmonary effort is normal. No respiratory distress.     Breath sounds: Normal breath sounds. No stridor. No rales.    Chest:     Chest wall: Tenderness present.  Abdominal:     General: There is no distension.     Palpations: Abdomen is soft.     Tenderness: There is no abdominal tenderness.  Musculoskeletal:        General: No tenderness.     Cervical back: Normal range of motion and neck supple.  Skin:    General: Skin is warm and dry.     Findings: No erythema or rash.  Neurological:     Mental Status: She is alert and oriented to person, place, and time.     ED Results and Treatments Labs (all labs ordered are listed, but only abnormal results are displayed) Labs Reviewed  URINALYSIS, ROUTINE W REFLEX MICROSCOPIC - Abnormal; Notable for the following components:      Result Value   APPearance HAZY (*)    All other components within normal limits  BASIC METABOLIC PANEL - Abnormal; Notable for the following components:   CO2 20 (*)    All other components within normal limits  CBC - Abnormal; Notable for the following components:   WBC 11.9 (*)    RBC 5.17 (*)    Hemoglobin 15.4 (*)    HCT 47.6 (*)    All other components within normal limits  D-DIMER, QUANTITATIVE (NOT AT [facility]) - Abnormal; Notable for the following components:   D-Dimer, Quant 0.65 (*)    All other components within normal limits  HEPATIC FUNCTION PANEL  LIPASE, BLOOD  EKG  EKG Interpretation  Date/Time:    Ventricular Rate:    PR Interval:    QRS Duration:   QT Interval:    QTC Calculation:   R  Axis:     Text Interpretation:        Radiology CXR 2 View - Chest pain  Result Date: 09/07/2019 CLINICAL DATA:  Pt with right upper flank pain that radiates around to the front side, associated with nausea x 1 am this morning. Pt states pain begun after getting up this morning. Denies urinary problems or cough. About 4 hours ago she took a.*comment was truncated* EXAM: CHEST - 2 VIEW COMPARISON:  None. FINDINGS: Normal mediastinum and cardiac silhouette. Normal pulmonary vasculature. No evidence of effusion, infiltrate, or pneumothorax. No acute bony abnormality. Posterior thoracolumbar fusion. IMPRESSION: No acute cardiopulmonary process. Electronically Signed   By: Suzy Bouchard M.D.   On: 09/07/2019 07:07   US Abdomen Limited RUQ  Result Date: 09/07/2019 CLINICAL DATA:  Right upper quadrant pain EXAM: ULTRASOUND ABDOMEN LIMITED RIGHT UPPER QUADRANT COMPARISON:  None. FINDINGS: Gallbladder: No gallstones or wall thickening visualized. No sonographic Murphy sign noted by sonographer. Common bile duct: Diameter: 3 mm Liver: No focal lesion identified. Within normal limits in parenchymal echogenicity. Portal vein is patent on color Doppler imaging with normal direction of blood flow towards the liver. Other: None. IMPRESSION: Normal right upper quadrant ultrasound Electronically Signed   By: Ulyses Jarred M.D.   On: 09/07/2019 01:19    Pertinent labs & imaging results that were available during my care of the patient were reviewed by me and considered in my medical decision making (see chart for details).  Medications Ordered in ED Medications  fentaNYL (SUBLIMAZE) injection 50 mcg (50 mcg Intravenous Given 09/07/19 0414)                                                                                                                                    Procedures Procedures  (including critical care time)  Medical Decision Making / ED Course I have reviewed the nursing notes for this  encounter and the patient's prior records (if available in EHR or on provided paperwork).   Shakeisha Shaddock was evaluated in Emergency Department on 09/07/2019 for the symptoms described in the history of present illness. She was evaluated in the context of the global COVID-19 pandemic, which necessitated consideration that the patient might be at risk for infection with the SARS-CoV-2 virus that causes COVID-19. Institutional protocols and algorithms that pertain to the evaluation of patients at risk for COVID-19 are in a state of rapid change based on information released by regulatory bodies including the CDC and federal and state organizations. These policies and algorithms were followed during the patient's care in the ED.  Right flank/upper back pain. Screening labs notable for mild leukocytosis but likely hemoconcentrated CBC.  No significant electrolyte derangements or renal sufficiency.  No evidence of  obstruction or pancreatitis.  Patient has tenderness to palpation to the right posterior/lateral chest wall.  No evidence of trauma or rash.  UA without evidence of infection.  Right upper quadrant ultrasound ordered in triage and negative for biliary disease.  Dimer below age-adjusted limit. Doubt PE. More likely muscular etiology. Chest x-ray without evidence suggestive of pneumonia, pneumothorax, pneumomediastinum.  No abnormal contour of the mediastinum to suggest dissection. No evidence of acute injuries.          Final Clinical Impression(s) / ED Diagnoses Final diagnoses:  Right flank pain  Thoracic back pain     The patient appears reasonably screened and/or stabilized for discharge and I doubt any other medical condition or other [facility] requiring further screening, evaluation, or treatment in the ED at this time prior to discharge.  Disposition: Discharge  Condition: Good  I have discussed the results, Dx and Tx plan with the patient who expressed understanding and agree(s)  with the plan. Discharge instructions discussed at great length. The patient was given strict return precautions who verbalized understanding of the instructions. No further questions at time of discharge.    ED Discharge Orders    None        Follow Up: [address], [place] [address] [phone]  Schedule an appointment as soon as possible for a visit  If symptoms do not improve or  worsen     This chart was dictated using voice recognition software.  Despite best efforts to proofread,  errors can occur which can change the documentation meaning.   Fatima Blank, MD 09/07/19 [phone]

## 2019-11-22 ENCOUNTER — Ambulatory Visit (INDEPENDENT_AMBULATORY_CARE_PROVIDER_SITE_OTHER): Payer: Medicare Other

## 2019-11-22 ENCOUNTER — Ambulatory Visit
Admission: EM | Admit: 2019-11-22 | Discharge: 2019-11-22 | Disposition: A | Discharge status: Home / Self Care | Payer: Medicare Other

## 2019-11-22 DIAGNOSIS — R0600 Dyspnea, unspecified: Secondary | ICD-10-CM | POA: Diagnosis not present

## 2019-11-22 DIAGNOSIS — R0602 Shortness of breath: Secondary | ICD-10-CM

## 2019-11-22 DIAGNOSIS — Z20822 Contact with and (suspected) exposure to covid-19: Secondary | ICD-10-CM | POA: Diagnosis not present

## 2019-11-22 DIAGNOSIS — J441 Chronic obstructive pulmonary disease with (acute) exacerbation: Secondary | ICD-10-CM

## 2019-11-22 DIAGNOSIS — R0789 Other chest pain: Secondary | ICD-10-CM | POA: Diagnosis not present

## 2019-11-22 MED ORDER — PREDNISONE 20 MG PO TABS: 20.0000 mg | ORAL_TABLET | Freq: Every day | ORAL | 0 refills | Status: AC

## 2019-11-22 NOTE — Discharge Instructions (Addendum)
Take steroid daily with breakfast. Continue albuterol treatments as prescribed by your PCP. Follow up with PCP on Monday. Go to ER sooner if you have worsening difficulty breathing, chest pain, fever, lightheadedness, dizziness, or you pass out.

## 2019-11-22 NOTE — ED Provider Notes (Signed)
[facility]    CSN: [id number] Arrival date & time: 11/22/19  1445      History   Chief Complaint Chief Complaint  Patient presents with  . Shortness of Breath    HPI Alexis Daniels is a 74 y.o. female with history of tobacco use, OSA, COPD, hypertension, ovarian cancer presenting for increased dyspnea and chest pain x1 week.  Patient seen by her PCP on 11/07/2019.  Had EKG done that was normal per provider's documentation.  Treated for chest wall pain with Toradol.  Since then has had improvement chest pain, though persistent dyspnea w/ some mild wheezing, for which she has used her nebulizer and inhaler: Last use was 2 hours PTA.  Patient denies hospitalization for breathing issues/COPD in the last year.  No fever, chills, myalgias or arthralgias.  Denies lower extremity edema, history of DVT/PE.  Denying lightheadedness, dizziness, nausea, chest pain or palpitations in office.   Past Medical History:  Diagnosis Date  . Anxiety   . Back pain   . COPD (chronic obstructive pulmonary disease) ([place])   . Depression   . Hypertension   . Ovarian cancer ([place])   . Sleep apnea   . Tobacco abuse     There are no problems to display for this patient.   Past Surgical History:  Procedure Laterality Date  . ABDOMINAL HYSTERECTOMY    . HYSTERECTOMY ABDOMINAL WITH SALPINGECTOMY    . JOINT REPLACEMENT    . KNEE SURGERY Right   . OOPHORECTOMY      OB History   No obstetric history on file.      Home Medications    Prior to Admission medications   Medication Sig Start Date End Date Taking? Authorizing Provider  acetaZOLAMIDE (DIAMOX) 500 MG capsule Take 500 mg by mouth 2 (two) times daily.   Yes [provider]  QUEtiapine (SEROQUEL) 100 MG tablet Take 100 mg by mouth.   Yes [provider]  albuterol (PROVENTIL HFA;VENTOLIN HFA) 108 (90 BASE) MCG/ACT inhaler Inhale 2 puffs into the lungs every 6 (six) hours as needed for wheezing or shortness of  breath.    [provider]  albuterol (PROVENTIL) (2.5 MG/3ML) 0.083% nebulizer solution Take 6 mLs (5 mg total) by nebulization every 6 (six) hours as needed for wheezing or shortness of breath. 03/12/15   Larene Pickett, PA-C  ALPRAZolam Duanne Moron) 0.5 MG tablet Take 0.5 mg by mouth at bedtime as needed for anxiety or sleep. anxiety 01/28/15   [provider]  amLODipine (NORVASC) 5 MG tablet Take 5 mg by mouth daily. 12/31/14   [provider]  cyclobenzaprine (FLEXERIL) 10 MG tablet Take 1 tablet (10 mg total) by mouth 2 (two) times daily as needed for muscle spasms. 07/07/16   Recardo Evangelist, PA-C  HYDROcodone-acetaminophen (NORCO/VICODIN) 5-325 MG tablet Take 1-2 tablets by mouth every 4 (four) hours as needed. 07/07/16   Recardo Evangelist, PA-C  predniSONE (DELTASONE) 20 MG tablet Take 1 tablet (20 mg total) by mouth daily with breakfast for 7 days. 11/22/19 11/29/19  Hall-Potvin, [place], PA-C    Family History Family History  Problem Relation Age of Onset  . Congestive Heart Failure Mother   . Heart disease Brother        heart exploded  . CAD Neg Hx     Social History Social History   Tobacco Use  . Smoking status: Never Smoker  . Smokeless tobacco: Current User    Types: Snuff  Substance  Use Topics  . Alcohol use: No  . Drug use: No     Allergies   Lyrica [pregabalin], Morphine and related, Penicillins, Spinach, Tramadol, and Tape   Review of Systems As per HPI   Physical Exam Triage Vital Signs ED Triage Vitals  Enc Vitals Group     BP 11/22/19 1459 (!) 154/94     Pulse Rate 11/22/19 1459 89     Resp 11/22/19 1459 16     Temp 11/22/19 1459 98.4 F (36.9 C)     Temp Source 11/22/19 1459 Oral     SpO2 11/22/19 1459 94 %     Weight --      Height --      Head Circumference --      Peak Flow --      Pain Score 11/22/19 1500 0     Pain Loc --      Pain Edu? --      Excl. in [place]? --    No data found.  Updated Vital Signs BP (!)  154/94 (BP Location: Left Arm)   Pulse 89   Temp 98.4 F (36.9 C) (Oral)   Resp 16   SpO2 94%   Visual Acuity Right Eye Distance:   Left Eye Distance:   Bilateral Distance:    Right Eye Near:   Left Eye Near:    Bilateral Near:     Physical Exam Constitutional:      General: She is not in acute distress.    Appearance: She is obese. She is not ill-appearing or diaphoretic.  HENT:     Head: Normocephalic and atraumatic.     Mouth/Throat:     Mouth: Mucous membranes are moist.     Pharynx: Oropharynx is clear. No oropharyngeal exudate or posterior oropharyngeal erythema.  Eyes:     General: No scleral icterus.    Conjunctiva/sclera: Conjunctivae normal.     Pupils: Pupils are equal, round, and reactive to light.  Neck:     Comments: Trachea midline, negative JVD Cardiovascular:     Rate and Rhythm: Normal rate and regular rhythm.     Heart sounds: No murmur. No gallop.   Pulmonary:     Effort: Pulmonary effort is normal. No tachypnea, accessory muscle usage or respiratory distress.     Breath sounds: No stridor. Wheezing present. No rhonchi or rales.     Comments: Mild expiratory wheezing bilaterally Chest:     Chest wall: No deformity, tenderness or crepitus.  Musculoskeletal:        General: Normal range of motion.     Cervical back: Neck supple. No tenderness.     Right lower leg: No edema.     Left lower leg: No edema.  Lymphadenopathy:     Cervical: No cervical adenopathy.  Skin:    Capillary Refill: Capillary refill takes less than 2 seconds.     Coloration: Skin is not cyanotic, jaundiced or pale.     Findings: No rash.  Neurological:     General: No focal deficit present.     Mental Status: She is alert and oriented to person, place, and time.      UC Treatments / Results  Labs (all labs ordered are listed, but only abnormal results are displayed) Labs Reviewed  NOVEL CORONAVIRUS, NAA    EKG   Radiology DG Chest 2 View  Result Date:  11/22/2019 CLINICAL DATA:  Chest pain short of breath EXAM: CHEST - 2 VIEW COMPARISON:  09/07/2019  FINDINGS: The heart size and mediastinal contours are within normal limits. Both lungs are clear. Rods and fixating screws in the lower thoracic spine. Treated compression deformity of the lower thoracic spine. IMPRESSION: No active cardiopulmonary disease. Electronically Signed   By: Donavan Foil M.D.   On: 11/22/2019 15:45    Procedures Procedures (including critical care time)  Medications Ordered in UC Medications - No data to display  Initial Impression / Assessment and Plan / UC Course  I have reviewed the triage vital signs and the nursing notes.  Pertinent labs & imaging results that were available during my care of the patient were reviewed by me and considered in my medical decision making (see chart for details).     Patient had EKG done at office visit with PCP on 11/07/2019 that was reportedly compared to previous from 09/11/2017: Documented and is without significant change, "normal EKG".  Unable to view EKGs from most recent office visit, 08/2017.  EKG done in office, compared to previous on file from 09/02/2016: NSR with ventricular rate 71 bpm.  No QTC prolongation, ST elevation or depression.  Mild QRS complex changes in leads III, aVF without reciprocal changes.  Patient does have voltage criteria for LVH.  Overall stable/nonacute EKG.  Chest x-ray obtained in office, reviewed by me and radiology and compared to previous from 09/07/2019: Negative for active cardiopulmonary disease.  Reviewed findings with patient verbalized understanding.  Will treat for COPD exacerbation as outlined below.  Covid test pending: Patient to quarantine until results are back.  Return precautions discussed, patient verbalized understanding and is agreeable to plan. Final Clinical Impressions(s) / UC Diagnoses   Final diagnoses:  Dyspnea, unspecified type  COPD exacerbation ([place])     Discharge  Instructions     Take steroid daily with breakfast. Continue albuterol treatments as prescribed by your PCP. Follow up with PCP on Monday. Go to ER sooner if you have worsening difficulty breathing, chest pain, fever, lightheadedness, dizziness, or you pass out.    ED Prescriptions    Medication Sig Dispense Auth. Provider   predniSONE (DELTASONE) 20 MG tablet Take 1 tablet (20 mg total) by mouth daily with breakfast for 7 days. 7 tablet Hall-Potvin, [place], PA-C     PDMP not reviewed this encounter.   Hall-Potvin, [place], [place] 11/23/19 [phone]

## 2019-11-22 NOTE — ED Triage Notes (Signed)
Pt c/o SOB and chest pain x1wk that's getting worse. States her PCP sent her here. Pt states her neb and inhaler isn't helping, last use was 2hrs ago.

## 2019-11-23 LAB — NOVEL CORONAVIRUS, NAA: SARS-CoV-2, NAA: NOT DETECTED

## 2021-11-18 ENCOUNTER — Ambulatory Visit: Payer: Medicare Other | Admitting: Cardiology

## 2024-01-16 ENCOUNTER — Ambulatory Visit (INDEPENDENT_AMBULATORY_CARE_PROVIDER_SITE_OTHER): Payer: Self-pay | Admitting: Licensed Clinical Social Worker

## 2024-01-16 ENCOUNTER — Encounter (HOSPITAL_COMMUNITY): Payer: Self-pay | Admitting: Licensed Clinical Social Worker

## 2024-01-16 DIAGNOSIS — Z91199 Patient's noncompliance with other medical treatment and regimen due to unspecified reason: Secondary | ICD-10-CM

## 2024-01-16 NOTE — Progress Notes (Signed)
 No show
